# Patient Record
Sex: Female | Born: 1970 | Race: White | Hispanic: No | Marital: Single | Smoking: Current every day smoker
Health system: Southern US, Community
[De-identification: ages and names within clinical notes are randomized; demographics above are authoritative.]

## PROBLEM LIST (undated history)

## (undated) DIAGNOSIS — D649 Anemia, unspecified: Secondary | ICD-10-CM

## (undated) DIAGNOSIS — F329 Major depressive disorder, single episode, unspecified: Secondary | ICD-10-CM

## (undated) DIAGNOSIS — F1910 Other psychoactive substance abuse, uncomplicated: Secondary | ICD-10-CM

## (undated) DIAGNOSIS — I219 Acute myocardial infarction, unspecified: Secondary | ICD-10-CM

## (undated) DIAGNOSIS — J449 Chronic obstructive pulmonary disease, unspecified: Secondary | ICD-10-CM

## (undated) DIAGNOSIS — F32A Depression, unspecified: Secondary | ICD-10-CM

## (undated) HISTORY — DX: Anemia, unspecified: D64.9

## (undated) HISTORY — PX: TUBAL LIGATION: SHX77

---

## 2002-04-16 ENCOUNTER — Emergency Department (HOSPITAL_COMMUNITY): Admission: EM | Admit: 2002-04-16 | Discharge: 2002-04-17 | Payer: Self-pay | Admitting: Emergency Medicine

## 2003-09-11 ENCOUNTER — Encounter: Payer: Self-pay | Admitting: Emergency Medicine

## 2003-09-11 ENCOUNTER — Emergency Department (HOSPITAL_COMMUNITY): Admission: EM | Admit: 2003-09-11 | Discharge: 2003-09-11 | Payer: Self-pay | Admitting: Emergency Medicine

## 2003-09-18 ENCOUNTER — Encounter: Admission: RE | Admit: 2003-09-18 | Discharge: 2003-09-18 | Payer: Self-pay | Admitting: Internal Medicine

## 2003-09-25 ENCOUNTER — Inpatient Hospital Stay (HOSPITAL_COMMUNITY): Admission: AD | Admit: 2003-09-25 | Discharge: 2003-09-30 | Payer: Self-pay | Admitting: Infectious Diseases

## 2003-09-25 ENCOUNTER — Encounter: Admission: RE | Admit: 2003-09-25 | Discharge: 2003-09-25 | Payer: Self-pay | Admitting: Internal Medicine

## 2003-09-27 ENCOUNTER — Encounter (INDEPENDENT_AMBULATORY_CARE_PROVIDER_SITE_OTHER): Payer: Self-pay | Admitting: Specialist

## 2003-09-28 ENCOUNTER — Encounter: Payer: Self-pay | Admitting: Infectious Diseases

## 2005-02-23 ENCOUNTER — Emergency Department (HOSPITAL_COMMUNITY): Admission: EM | Admit: 2005-02-23 | Discharge: 2005-02-23 | Payer: Self-pay | Admitting: Emergency Medicine

## 2005-05-16 ENCOUNTER — Emergency Department: Payer: Self-pay | Admitting: Internal Medicine

## 2005-05-16 ENCOUNTER — Other Ambulatory Visit: Payer: Self-pay

## 2006-04-28 ENCOUNTER — Emergency Department (HOSPITAL_COMMUNITY): Admission: EM | Admit: 2006-04-28 | Discharge: 2006-04-28 | Payer: Self-pay | Admitting: *Deleted

## 2006-04-29 ENCOUNTER — Ambulatory Visit: Payer: Self-pay | Admitting: Family Medicine

## 2006-06-18 ENCOUNTER — Emergency Department: Payer: Self-pay | Admitting: General Practice

## 2006-06-24 ENCOUNTER — Ambulatory Visit: Payer: Self-pay | Admitting: Gastroenterology

## 2006-07-26 ENCOUNTER — Ambulatory Visit: Payer: Self-pay | Admitting: Gastroenterology

## 2006-07-29 ENCOUNTER — Ambulatory Visit: Payer: Self-pay | Admitting: Gastroenterology

## 2007-11-28 ENCOUNTER — Emergency Department: Payer: Self-pay | Admitting: Emergency Medicine

## 2008-12-17 ENCOUNTER — Ambulatory Visit: Payer: Self-pay | Admitting: Family Medicine

## 2008-12-17 DIAGNOSIS — K219 Gastro-esophageal reflux disease without esophagitis: Secondary | ICD-10-CM

## 2008-12-17 DIAGNOSIS — R109 Unspecified abdominal pain: Secondary | ICD-10-CM | POA: Insufficient documentation

## 2008-12-17 DIAGNOSIS — F341 Dysthymic disorder: Secondary | ICD-10-CM

## 2008-12-17 DIAGNOSIS — F17200 Nicotine dependence, unspecified, uncomplicated: Secondary | ICD-10-CM

## 2008-12-31 ENCOUNTER — Ambulatory Visit: Payer: Self-pay | Admitting: Family Medicine

## 2009-01-07 ENCOUNTER — Encounter: Payer: Self-pay | Admitting: *Deleted

## 2009-01-16 ENCOUNTER — Encounter: Payer: Self-pay | Admitting: Family Medicine

## 2009-01-16 ENCOUNTER — Ambulatory Visit: Payer: Self-pay | Admitting: Family Medicine

## 2009-01-16 DIAGNOSIS — F319 Bipolar disorder, unspecified: Secondary | ICD-10-CM | POA: Insufficient documentation

## 2009-01-16 LAB — CONVERTED CEMR LAB
BUN: 8 mg/dL (ref 6–23)
Calcium: 8.5 mg/dL (ref 8.4–10.5)
Chloride: 107 meq/L (ref 96–112)
Glucose, Bld: 85 mg/dL (ref 70–99)
Potassium: 4.5 meq/L (ref 3.5–5.3)

## 2009-02-12 ENCOUNTER — Telehealth: Payer: Self-pay | Admitting: Family Medicine

## 2009-02-18 ENCOUNTER — Telehealth: Payer: Self-pay | Admitting: Family Medicine

## 2009-02-25 ENCOUNTER — Telehealth: Payer: Self-pay | Admitting: *Deleted

## 2009-03-07 ENCOUNTER — Ambulatory Visit: Payer: Self-pay | Admitting: Family Medicine

## 2009-05-06 ENCOUNTER — Encounter: Payer: Self-pay | Admitting: Family Medicine

## 2009-05-06 ENCOUNTER — Ambulatory Visit: Payer: Self-pay | Admitting: Family Medicine

## 2009-06-07 ENCOUNTER — Ambulatory Visit: Payer: Self-pay | Admitting: Family Medicine

## 2009-07-10 ENCOUNTER — Ambulatory Visit: Payer: Self-pay | Admitting: Family Medicine

## 2009-07-10 ENCOUNTER — Encounter: Payer: Self-pay | Admitting: Family Medicine

## 2009-07-16 LAB — CONVERTED CEMR LAB
Cocaine Metabolites: NEGATIVE
Creatinine,U: 68.1 mg/dL
Opiate Screen, Urine: NEGATIVE

## 2009-08-09 ENCOUNTER — Ambulatory Visit: Payer: Self-pay | Admitting: Family Medicine

## 2009-09-12 ENCOUNTER — Ambulatory Visit: Payer: Self-pay | Admitting: Family Medicine

## 2009-10-11 ENCOUNTER — Ambulatory Visit: Payer: Self-pay | Admitting: Family Medicine

## 2009-12-02 ENCOUNTER — Ambulatory Visit: Payer: Self-pay | Admitting: Family Medicine

## 2009-12-30 ENCOUNTER — Telehealth: Payer: Self-pay | Admitting: *Deleted

## 2010-01-30 ENCOUNTER — Ambulatory Visit: Payer: Self-pay | Admitting: Family Medicine

## 2010-03-05 ENCOUNTER — Ambulatory Visit: Payer: Self-pay | Admitting: Family Medicine

## 2010-03-05 ENCOUNTER — Encounter: Payer: Self-pay | Admitting: Family Medicine

## 2010-03-05 DIAGNOSIS — D649 Anemia, unspecified: Secondary | ICD-10-CM | POA: Insufficient documentation

## 2010-03-05 LAB — CONVERTED CEMR LAB
ALT: 35 units/L (ref 0–35)
AST: 44 units/L — ABNORMAL HIGH (ref 0–37)
Albumin: 3.8 g/dL (ref 3.5–5.2)
Alkaline Phosphatase: 83 units/L (ref 39–117)
BUN: 5 mg/dL — ABNORMAL LOW (ref 6–23)
Calcium: 8.5 mg/dL (ref 8.4–10.5)
Chloride: 104 meq/L (ref 96–112)
HCT: 30 % — ABNORMAL LOW (ref 36.0–46.0)
Hemoglobin: 8.1 g/dL — ABNORMAL LOW (ref 12.0–15.0)
Lipase: 17 units/L (ref 0–75)
MCHC: 27 g/dL — ABNORMAL LOW (ref 30.0–36.0)
Potassium: 4.9 meq/L (ref 3.5–5.3)
Sodium: 135 meq/L (ref 135–145)
Total Bilirubin: 0.2 mg/dL — ABNORMAL LOW (ref 0.3–1.2)
WBC: 4.8 10*3/uL (ref 4.0–10.5)

## 2010-03-06 ENCOUNTER — Encounter: Payer: Self-pay | Admitting: Family Medicine

## 2010-03-07 LAB — CONVERTED CEMR LAB
Ferritin: 3 ng/mL — ABNORMAL LOW (ref 10–291)
Vitamin B-12: 233 pg/mL (ref 211–911)

## 2010-03-26 ENCOUNTER — Emergency Department (HOSPITAL_COMMUNITY): Admission: EM | Admit: 2010-03-26 | Discharge: 2010-03-27 | Payer: Self-pay | Admitting: Emergency Medicine

## 2010-06-03 ENCOUNTER — Encounter: Payer: Self-pay | Admitting: Family Medicine

## 2010-06-12 ENCOUNTER — Ambulatory Visit: Payer: Self-pay | Admitting: Family Medicine

## 2010-07-24 ENCOUNTER — Ambulatory Visit: Payer: Self-pay | Admitting: Family Medicine

## 2010-07-24 ENCOUNTER — Encounter: Payer: Self-pay | Admitting: Family Medicine

## 2010-07-24 DIAGNOSIS — D509 Iron deficiency anemia, unspecified: Secondary | ICD-10-CM

## 2010-07-25 LAB — CONVERTED CEMR LAB
AST: 32 units/L (ref 0–37)
Alkaline Phosphatase: 55 units/L (ref 39–117)
BUN: 5 mg/dL — ABNORMAL LOW (ref 6–23)
CO2: 26 meq/L (ref 19–32)
Chloride: 105 meq/L (ref 96–112)
Ferritin: 5 ng/mL — ABNORMAL LOW (ref 10–291)
Glucose, Bld: 89 mg/dL (ref 70–99)
Hemoglobin: 9 g/dL — ABNORMAL LOW (ref 12.0–15.0)
Iron: 14 ug/dL — ABNORMAL LOW (ref 42–145)
Platelets: 311 10*3/uL (ref 150–400)
Potassium: 4 meq/L (ref 3.5–5.3)
Saturation Ratios: 3 % — ABNORMAL LOW (ref 20–55)
Sodium: 137 meq/L (ref 135–145)
TIBC: 412 ug/dL (ref 250–470)
Total Protein: 7 g/dL (ref 6.0–8.3)
Valproic Acid Lvl: 26 ug/mL — ABNORMAL LOW (ref 50.0–100.0)

## 2010-08-11 ENCOUNTER — Telehealth: Payer: Self-pay | Admitting: Family Medicine

## 2010-08-12 ENCOUNTER — Telehealth: Payer: Self-pay | Admitting: Family Medicine
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
## 2011-01-20 NOTE — Miscellaneous (Signed)
Summary: patient summary  Clinical Lists Changes  Problems: Assessed ABDOMINAL PAIN as comment only - unclear etiology have tried multiple times to get records from [facility] where patient reports she's had a workup.  patient reports she thinks she was told she had celiac sprue but patient doesn't follow gluten free diet.  thought this could be related to menses - tried on depoprovera despite having BTL to try to stop menses which were contributory. has been managed by prior pcp from outside clinic with chronic narcotics.  we have been trying to slowly taper these down. she recently went to ED to try to stop narcotics - would pursue this.  she is under controlled substances agreement at [facility]. we have started workup at [facility] because we couldn't get outside records.  discovered anemia (fairly profound) but we have been unable to get in touch with patient to get her in for further workup of anemia.  Assessed BIPOLAR DISORDER UNSPECIFIED as comment only - I suspect this is at least part of the reason for her abdominal pain.  she is likely bipolar 2 with only hypomanic symptoms - predominately depressive symptoms. she is being treated with citalopram but hasn't followed up as instructed to see how it is doing for her.    Assessed ANEMIA as comment only - see abdominal pain assessment Observations: Added new observation of PAST MED HX: stomach problems/chronic abdominal pain - GERD [id number] - both vaginal deliveries depression -? bipolar, anxiety ANEMIA (ICD-285.9)  TOBACCO ABUSE (ICD-305.1)    (06/03/2010 9:21)      Past History:  Past Medical History: stomach problems/chronic abdominal pain - GERD [id number] - both vaginal deliveries depression -? bipolar, anxiety ANEMIA (ICD-285.9)  TOBACCO ABUSE (ICD-305.1)    Impression & Recommendations:  Problem # 1:  ABDOMINAL PAIN (ICD-789.00) unclear etiology have tried multiple times to get records from [facility] where patient reports she's  had a workup.  patient reports she thinks she was told she had celiac sprue but patient doesn't follow gluten free diet.  thought this could be related to menses - tried on depoprovera despite having BTL to try to stop menses which were contributory. has been managed by prior pcp from outside clinic with chronic narcotics.  we have been trying to slowly taper these down. she recently went to ED to try to stop narcotics - would pursue this.  she is under controlled substances agreement at [facility]. we have started workup at [facility] because we couldn't get outside records.  discovered anemia (fairly profound) but we have been unable to get in touch with patient to get her in for further workup of anemia.  Problem # 2:  BIPOLAR DISORDER UNSPECIFIED (ICD-296.80) I suspect this is at least part of the reason for her abdominal pain.  she is likely bipolar 2 with only hypomanic symptoms - predominately depressive symptoms. she is being treated with citalopram but hasn't followed up as instructed to see how it is doing for her.  Problem # 3:  ANEMIA (ICD-285.9) see abdominal pain assessment  Complete Medication List: 1)  Omeprazole 40 Mg Cpdr (Omeprazole) .Marland Kitchen.. 1 by mouth two times a day for reflux/stomach pain 2)  Colace 100 Mg Caps (Docusate sodium) .Marland Kitchen.. 1 by mouth qam 3)  Citalopram Hydrobromide 40 Mg Tabs (Citalopram hydrobromide) .... Taper up as directed to 1.5 tabs daily 4)  Oxycodone-acetaminophen 5-325 Mg Tabs (Oxycodone-acetaminophen) .Marland Kitchen.. 1 two times a day as needed severe pain. 5)  Oxycodone Hcl 5 Mg Tabs (Oxycodone hcl) .Marland KitchenMarland KitchenMarland Kitchen 1  by mouth two times a day as needed severe pain. 6)  Miralax Powd (Polyethylene glycol 3350) .Marland Kitchen.. 1 dose daily for constipation
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
## 2011-01-20 NOTE — Progress Notes (Signed)
Summary: RX Req  Phone Note Refill Request Call back at Home Phone [phone] Message from:  Patient  Refills Requested: Medication #1:  BACLOFEN 20 MG TABS 1/2 tab by mouth three times a day [facility]  Initial call taken by: Raymond Gurney,  August 12, 2010 11:07 AM    Prescriptions: OMEPRAZOLE 40 MG CPDR (OMEPRAZOLE) 1 by mouth two times a day for reflux/stomach pain  #60 x 1   Entered and Authorized by:   Mariana Arn  MD   Signed by:   Mariana Arn  MD on 08/13/2010   Method used:   Electronically to        [facility] (retail)       [address]       [place], [place]  [address]       Ph: [phone]       Fax: [phone]   RxID:   [id number] BACLOFEN 20 MG TABS (BACLOFEN) 1/2 tab by mouth three times a day  #41 x 0   Entered and Authorized by:   Mariana Arn  MD   Signed by:   Mariana Arn  MD on 08/13/2010   Method used:   [facility]ically to        [facility] [address] [phone][facility] (retail)       [address]       [place], [place]  [address]       Ph: [phone] or [phone]       Fax: [phone]   RxID:   [id number] BACLOFEN 20 MG TABS (BACLOFEN) 1/2 tab by mouth three times a day  #41 x 0   Entered and Authorized by:   Mariana Arn  MD   Signed by:   Mariana Arn  MD on 08/13/2010   Method used:   Electronically to        [facility] (retail)       [address]       [place], [place]  [address]       Ph: [phone]       Fax: [phone]   [place]:   [phone]

## 2011-01-20 NOTE — Progress Notes (Signed)
Summary: refill  Phone Note Refill Request Call back at [phone] Message from:  Patient  Refills Requested: Medication #1:  CITALOPRAM HYDROBROMIDE 40 MG TABS taper up as directed to 1 tab daily. Initial call taken by: Audie Clear,  December 30, 2009 8:35 AM  Follow-up for Phone Call       Follow-up by: Elige Radon RN,  December 30, 2009 8:44 AM    Prescriptions: CITALOPRAM HYDROBROMIDE 40 MG TABS (CITALOPRAM HYDROBROMIDE) taper up as directed to 1 tab daily  #30 x 3   Entered by:   Elige Radon RN   Authorized by:   Patria Mane  MD   Signed by:   Elige Radon RN on 12/30/2009   Method used:   Electronically to        [facility] (retail)       [address]       [place], [place]  [address]       Ph: [phone]       Fax: [phone]   RxID:   [id number]
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
## 2011-01-20 NOTE — Progress Notes (Signed)
Summary: refill  Phone Note Refill Request Call back at Home Phone [phone] Message from:  Patient  Refills Requested: Medication #1:  OMEPRAZOLE 40 MG CPDR 1 by mouth two times a day for reflux/stomach pain [facility]  Initial call taken by: Audie Clear,  August 11, 2010 10:25 AM    Prescriptions: OMEPRAZOLE 40 MG CPDR (OMEPRAZOLE) 1 by mouth two times a day for reflux/stomach pain  #60 x 3   Entered and Authorized by:   Mariana Arn  MD   Signed by:   Mariana Arn  MD on 08/11/2010   Method used:   [facility]ronically to        [facility] [address] [phone]* (retail)       [address]       [place], [place]  [address]       Ph: [phone] or [phone]       Fax: [phone]   RxID:   [id number]

## 2011-03-11 LAB — RAPID URINE DRUG SCREEN, HOSP PERFORMED
Amphetamines: NOT DETECTED
Barbiturates: NOT DETECTED
Tetrahydrocannabinol: POSITIVE — AB

## 2011-03-11 LAB — POCT I-STAT, CHEM 8
Calcium, Ion: 1.1 mmol/L — ABNORMAL LOW (ref 1.12–1.32)
Chloride: 102 mEq/L (ref 96–112)
Hemoglobin: 9.2 g/dL — ABNORMAL LOW (ref 12.0–15.0)
Potassium: 3.1 mEq/L — ABNORMAL LOW (ref 3.5–5.1)

## 2011-03-11 LAB — ACETAMINOPHEN LEVEL: Acetaminophen (Tylenol), Serum: <10 ug/mL — ABNORMAL LOW (ref 10–30)

## 2011-04-13 ENCOUNTER — Emergency Department (HOSPITAL_COMMUNITY)
Admission: EM | Admit: 2011-04-13 | Discharge: 2011-04-13 | Disposition: A | Discharge status: Home / Self Care | Payer: Self-pay | Attending: Emergency Medicine | Admitting: Emergency Medicine

## 2011-04-13 DIAGNOSIS — R112 Nausea with vomiting, unspecified: Secondary | ICD-10-CM | POA: Insufficient documentation

## 2011-04-13 DIAGNOSIS — R109 Unspecified abdominal pain: Secondary | ICD-10-CM | POA: Insufficient documentation

## 2011-04-13 DIAGNOSIS — G8929 Other chronic pain: Secondary | ICD-10-CM | POA: Insufficient documentation

## 2011-04-13 LAB — CBC
MCHC: 31.5 g/dL (ref 30.0–36.0)
Platelets: 382 10*3/uL (ref 150–400)
WBC: 6.6 10*3/uL (ref 4.0–10.5)

## 2011-04-13 LAB — LIPASE, BLOOD: Lipase: 25 U/L (ref 11–59)

## 2011-04-13 LAB — COMPREHENSIVE METABOLIC PANEL
ALT: 62 U/L — ABNORMAL HIGH (ref 0–35)
AST: 63 U/L — ABNORMAL HIGH (ref 0–37)
Albumin: 2.4 g/dL — ABNORMAL LOW (ref 3.5–5.2)
Alkaline Phosphatase: 103 U/L (ref 39–117)
BUN: 10 mg/dL (ref 6–23)
Calcium: 8.2 mg/dL — ABNORMAL LOW (ref 8.4–10.5)
GFR calc Af Amer: >60 mL/min (ref >60)
Glucose, Bld: 86 mg/dL (ref 70–99)
Total Protein: 5.5 g/dL — ABNORMAL LOW (ref 6.0–8.3)

## 2011-04-13 LAB — URINALYSIS, ROUTINE W REFLEX MICROSCOPIC
Ketones, ur: NEGATIVE mg/dL
Nitrite: NEGATIVE
Protein, ur: NEGATIVE mg/dL
Specific Gravity, Urine: 1.017 (ref 1.005–1.030)
Urobilinogen, UA: 1 mg/dL (ref 0.0–1.0)

## 2011-04-13 LAB — DIFFERENTIAL
Basophils Absolute: 0 10*3/uL (ref 0.0–0.1)
Basophils Relative: 0 % (ref 0–1)
Lymphocytes Relative: 29 % (ref 12–46)
Monocytes Absolute: 0.6 10*3/uL (ref 0.1–1.0)
Monocytes Relative: 9 % (ref 3–12)
Neutro Abs: 3.8 10*3/uL (ref 1.7–7.7)

## 2011-05-08 NOTE — Op Note (Signed)
   NAME:  Denise Alexander, STANISH                           ACCOUNT NO.:  [id number]   MEDICAL RECORD NO.:  [id number]                   PATIENT TYPE:  INP   LOCATION:  4704                                 FACILITY:  [facility]   PHYSICIAN:  Ronald Lobo, M.D.                DATE OF BIRTH:  05/05/1971   DATE OF PROCEDURE:  09/27/2003  DATE OF DISCHARGE:                                 OPERATIVE REPORT   PROCEDURE:  Upper endoscopy with biopsies.   INDICATIONS FOR PROCEDURE:  A 40 year old female with heme positive stool,  anemia and history of recent dyspeptic symptoms in the setting of  significant aspirin exposure.   FINDINGS:  Normal exam.   DESCRIPTION OF PROCEDURE:  The nature, purpose and risk of the procedure had  been discussed with the patient who provided written consent. Sedation was  fentanyl 100 mcg and Versed 10 mg IV without arrhythmias or desaturation.  The patient also received Phenergan 25 mg IV as an adjunct to her sedation.   The Olympus small caliber adult video endoscope is passed under direct  vision. The esophagus was readily entered and was normal in its entirety,  without evidence of reflux esophagitis, Barrett's esophagus, varices,  infection, neoplasia or any ring, stricture, or hiatal hernia.   The stomach contained a small bilious residual which was suctioned up. No  gastritis, erosions, ulcers, polyps or masses were observed. The pylorus  duodenal bulb and duodenal mucosa looked normal. Random biopsies were  obtained from the duodenum to help rule out celiac disease. The scope was  then removed from the patient. Retroflexed viewing was unremarkable. She  tolerated the procedure well and there were no apparent complications.   IMPRESSION:  Essentially normal endoscopy. No source of heme positive stool,  anemia, dyspeptic symptoms or evidence of aspirin, gastropathy identified.   PLAN:  Await pathology results.     Ronald Lobo, M.D.    RB/MEDQ  D:  09/27/2003  T:  09/27/2003  Job:  [id number]   cc:   [facility]

## 2011-05-08 NOTE — Op Note (Signed)
   NAME:  Denise Alexander, Denise Alexander                           ACCOUNT NO.:  [id number]   MEDICAL RECORD NO.:  [id number]                   PATIENT TYPE:  INP   LOCATION:  4704                                 FACILITY:  [facility]   PHYSICIAN:  Ronald Lobo, M.D.                DATE OF BIRTH:  1971/02/27   DATE OF PROCEDURE:  09/27/2003  DATE OF DISCHARGE:                                 OPERATIVE REPORT   PROCEDURE:  Colonoscopy with biopsy.   INDICATIONS FOR PROCEDURE:  A 40 year old female with distant family history  of colon cancer, heme positive stool, anemia, and unexplained lower  abdominal pain.   FINDINGS:  Essentially normal exam. Tiny rectal polyp removed.   DESCRIPTION OF PROCEDURE:  The nature, purpose and risk of the procedure had  been discussed with the patient who provided written consent and was brought  from her hospital room to the endoscopy unit. The prep was Fleets Phospho-  Soda and the quality of the prep was excellent so it is felt that all areas  were well seen. Sedation for this procedure and the upper endoscopy which  preceded it totaled fentanyl 125 mcg, Versed 12.5 mg and Phenergan 25 mg IV  without arrhythmias or desaturation.   The Olympus adjustable tension pediatric video colonoscope was advanced to  the terminal ileum which was entered for a mild to moderate distance and  pullback was then performed. We turned the patient into the supine position  to facilitate advancement during the exam and there was a little bit of  external abdominal compression applied as well to help facilitate  advancement.   Pullback was then performed. There was a 2-3 mm sessile hyperplastic  appearing rectal polyp at about 15 cm removed by a single cold biopsy. No  large polyps, cancer, colitis, vascular malformations or diverticulosis were  noted. Retroflexion was not performed in the rectum due to a small rectal  ampulla but antegrade viewing disclosed no distal rectal lesions.   The patient tolerated the procedure well and there were no apparent  complications.   IMPRESSION:  Tiny rectal polyp, probably not clinically significant. No  source of heme positive stool, anemia or low abdominal pain identified.   PLAN:  Await pathology results. The patient should probably have continued  periodic screening colonoscopy every 5-10 years or so or undergo some other  form of colon cancer screening at the discretion of her primary physician.                                               Ronald Lobo, M.D.    RB/MEDQ  D:  09/27/2003  T:  09/27/2003  Job:  [id number]   cc:   [facility]
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
## 2011-10-29 ENCOUNTER — Observation Stay (HOSPITAL_COMMUNITY)
Admission: EM | Admit: 2011-10-29 | Discharge: 2011-10-30 | Disposition: A | Discharge status: Home / Self Care | Payer: Self-pay | Attending: Emergency Medicine | Admitting: Emergency Medicine

## 2011-10-29 ENCOUNTER — Encounter: Payer: Self-pay | Admitting: Emergency Medicine

## 2011-10-29 DIAGNOSIS — M25579 Pain in unspecified ankle and joints of unspecified foot: Secondary | ICD-10-CM | POA: Insufficient documentation

## 2011-10-29 DIAGNOSIS — M79609 Pain in unspecified limb: Secondary | ICD-10-CM | POA: Insufficient documentation

## 2011-10-29 DIAGNOSIS — Z7982 Long term (current) use of aspirin: Secondary | ICD-10-CM | POA: Insufficient documentation

## 2011-10-29 DIAGNOSIS — M7989 Other specified soft tissue disorders: Secondary | ICD-10-CM | POA: Insufficient documentation

## 2011-10-29 DIAGNOSIS — R609 Edema, unspecified: Secondary | ICD-10-CM | POA: Insufficient documentation

## 2011-10-29 DIAGNOSIS — Z79899 Other long term (current) drug therapy: Secondary | ICD-10-CM | POA: Insufficient documentation

## 2011-10-29 DIAGNOSIS — M129 Arthropathy, unspecified: Principal | ICD-10-CM | POA: Insufficient documentation

## 2011-10-29 DIAGNOSIS — M1990 Unspecified osteoarthritis, unspecified site: Secondary | ICD-10-CM

## 2011-10-29 MED ORDER — ONDANSETRON 4 MG PO TBDP
4.0000 mg | ORAL_TABLET | Freq: Once | ORAL | Status: AC
  Administered 2011-10-29: 4 mg via ORAL
  Filled 2011-10-29: qty 1

## 2011-10-29 NOTE — ED Notes (Signed)
The sister with the pt has been out to the desk complaining that she has not been seen yet. She appears to be getting more and more agitated.

## 2011-10-29 NOTE — ED Notes (Signed)
Th ept has had both feet and leg swelling for  One week.  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  She has pain in that area

## 2011-10-29 NOTE — ED Notes (Signed)
PT. REPORTS BILATERAL LOWER LEG EDEMA / SWELLING X 1 WEEK , DENIES INJURY OR FALL . ANKLES PAINFULL TO TOUCH .  SLIGHT CHILLS . NO FEVER . DENIES SOB.

## 2011-10-30 ENCOUNTER — Other Ambulatory Visit: Payer: Self-pay

## 2011-10-30 ENCOUNTER — Emergency Department (HOSPITAL_COMMUNITY): Payer: Self-pay

## 2011-10-30 ENCOUNTER — Encounter (HOSPITAL_COMMUNITY): Payer: Self-pay | Admitting: Radiology

## 2011-10-30 DIAGNOSIS — M7989 Other specified soft tissue disorders: Secondary | ICD-10-CM

## 2011-10-30 DIAGNOSIS — M79609 Pain in unspecified limb: Secondary | ICD-10-CM

## 2011-10-30 LAB — URINALYSIS, ROUTINE W REFLEX MICROSCOPIC
Hgb urine dipstick: NEGATIVE
Protein, ur: NEGATIVE mg/dL
Specific Gravity, Urine: 1.022 (ref 1.005–1.030)
pH: 6 (ref 5.0–8.0)

## 2011-10-30 LAB — CBC
HCT: 29.2 % — ABNORMAL LOW (ref 36.0–46.0)
Platelets: 297 10*3/uL (ref 150–400)
WBC: 3.4 10*3/uL — ABNORMAL LOW (ref 4.0–10.5)

## 2011-10-30 LAB — POCT I-STAT TROPONIN I: Troponin i, poc: 0 ng/mL (ref 0.00–0.08)

## 2011-10-30 LAB — POCT I-STAT, CHEM 8
Creatinine, Ser: 0.6 mg/dL (ref 0.50–1.10)
HCT: 33 % — ABNORMAL LOW (ref 36.0–46.0)
Hemoglobin: 11.2 g/dL — ABNORMAL LOW (ref 12.0–15.0)
Sodium: 140 mEq/L (ref 135–145)
TCO2: 24 mmol/L (ref 0–100)

## 2011-10-30 LAB — URINE MICROSCOPIC-ADD ON

## 2011-10-30 LAB — PROTIME-INR: Prothrombin Time: 15.8 seconds — ABNORMAL HIGH (ref 11.6–15.2)

## 2011-10-30 MED ORDER — POTASSIUM CHLORIDE CRYS ER 20 MEQ PO TBCR
40.0000 meq | EXTENDED_RELEASE_TABLET | Freq: Once | ORAL | Status: AC
  Administered 2011-10-30: 40 meq via ORAL
  Filled 2011-10-30: qty 2

## 2011-10-30 MED ORDER — IOHEXOL 350 MG/ML SOLN
100.0000 mL | Freq: Once | INTRAVENOUS | Status: AC | PRN
  Administered 2011-10-30: 100 mL via INTRAVENOUS

## 2011-10-30 MED ORDER — KETOROLAC TROMETHAMINE 30 MG/ML IJ SOLN
30.0000 mg | Freq: Once | INTRAMUSCULAR | Status: AC
  Administered 2011-10-30: 30 mg via INTRAVENOUS

## 2011-10-30 MED ORDER — IBUPROFEN 800 MG PO TABS: 800.0000 mg | ORAL_TABLET | Freq: Three times a day (TID) | ORAL | Status: AC

## 2011-10-30 MED ORDER — KETOROLAC TROMETHAMINE 30 MG/ML IJ SOLN
INTRAMUSCULAR | Status: AC
  Administered 2011-10-30: 30 mg via INTRAVENOUS
  Filled 2011-10-30: qty 1

## 2011-10-30 MED ORDER — ENOXAPARIN SODIUM 60 MG/0.6ML ~~LOC~~ SOLN
50.0000 mg | SUBCUTANEOUS | Status: AC
  Administered 2011-10-30: 50 mg via SUBCUTANEOUS
  Filled 2011-10-30: qty 0.6

## 2011-10-30 NOTE — ED Notes (Signed)
Patient to CT.

## 2011-10-30 NOTE — ED Notes (Signed)
Meal given to patient 

## 2011-10-30 NOTE — ED Provider Notes (Signed)
Pt in CDU on DVT protocol.  She continues to have pain bilateral ankles but otherwise feeling well.  On re-examination, afebrile, VSS, mild edema lateral ankles w/out overlying erythema and tenderness to light palpation bilateral medial/lateral malleolus.  Pain w/ passive ROM ankle.  No edema/tenderness lower legs or feet.  2+ DP pulses and sensation intact.  Dopplers pending.  Toradol ordered for pain.  8:09 AM   Pt reports some relief of pain w/ toradol.  Dopplers neg for DVT.  Uric acid ordered and w/in nml range.  All results discussed w/ pt and I explained that life threatening causes of sx were ruled out w/ exam and [place].  Recommended f/u with her PCP because she may need inflammatory markers checked.  Also recommended RICE and that she use a walker until the pain improves.  Pt suddenly became very angry because she wasted an entire night for nothing.  I again explained that we had ruled out serious causes of ankle pain/edema and have given her recommendations for further management.  She yelled that she had already tried everything I recommended.  Charge nurse tried to calm pt but she stormed out.    [place], [place] 10/30/11 1831

## 2011-10-30 NOTE — Progress Notes (Signed)
ANTICOAGULATION CONSULT NOTE -  Follow Up Consult  Pharmacy Consult for lovenox  Indication: DVT  No Known Allergies  Patient Measurements:   Adjusted Body Weight: 46kg  Vital Signs: Temp: 98.2 F (36.8 C) (11/09 0316) Temp src: Oral (11/09 0316) BP: 112/74 mmHg (11/09 0316) Pulse Rate: 60  (11/09 0316)  Labs:  Basename 10/30/11 0300 10/30/11 0046 10/30/11 0018  HGB -- 11.2* 9.2*  HCT -- 33.0* 29.2*  PLT -- -- 297  APTT 32 -- --  LABPROT 15.8* -- --  INR 1.23 -- --  HEPARINUNFRC -- -- --  CREATININE -- 0.60 --  CKTOTAL -- -- --  CKMB -- -- --  TROPONINI -- -- --   The CrCl is unknown because both a height and weight (above a minimum accepted value) are required for this calculation.   Medications:   (Not in a hospital admission)  Assessment:  Lovenox for r/o dvt. Pt holding in cdu for observation  Goal of Therapy:  lovenox 49m/kg     Plan:  Lovenox 50 mg x1 dose to send to cdu  ECurlene Dolphin11/08/2011,4:09 AM

## 2011-10-30 NOTE — ED Notes (Signed)
Patient awake and spouse at bedside . Pt awaiting lower extremity doppler studies.  Pt placed in gown to be prepared for study.  She is aware a breakfast tray has been ordered. Coffee to her as requested.  Pt states most of her pain is in her ankles and feet. States is worse when she stands. 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  Her left foot appears more swollen that right. Bilateral pedal pulses are strong. No redness noted. Pt is agreeable to plan of care

## 2011-10-30 NOTE — ED Provider Notes (Addendum)
History     CSN: [id number] Arrival date & time: 10/29/2011  9:00 PM   First MD Initiated Contact with Patient 10/29/11 2311      Chief Complaint  Patient presents with  . Leg Swelling    (Consider location/radiation/quality/duration/timing/severity/associated sxs/prior treatment) Patient is a 40 y.o. female presenting with leg pain.  Leg Pain  The incident occurred more than 1 week ago. The incident occurred at work. There was no injury mechanism. The pain location is generalized. The quality of the pain is described as throbbing. The pain is at a severity of 8/10. The pain is severe. The pain has been constant since onset. Pertinent negatives include no numbness, no inability to bear weight, no loss of motion, no muscle weakness, no loss of sensation and no tingling. She reports no foreign bodies present. The symptoms are aggravated by nothing. She has tried nothing for the symptoms. The treatment provided no relief.  B symmetrical peripheral edema.  Improves only slightly with elevation.  No long car trips or plane trips no OCP.  No CP, SOB, DOE. No n/v/d.   History reviewed. No pertinent past medical history.  History reviewed. No pertinent past surgical history.  No family history on file.  History  Substance Use Topics  . Smoking status: Current Everyday Smoker  . Smokeless tobacco: Not on file  . Alcohol Use: No    OB History    Grav Para Term Preterm Abortions TAB SAB Ect Mult Living                  Review of Systems  Constitutional: Negative for fever, chills and activity change.  HENT: Negative for facial swelling.   Eyes: Negative for discharge.  Respiratory: Negative for chest tightness and shortness of breath.   Cardiovascular: Negative for chest pain.  Gastrointestinal: Negative for abdominal distention.  Genitourinary: Negative for difficulty urinating.  Musculoskeletal: Positive for arthralgias. Negative for gait problem.  Neurological: Negative for  tingling, weakness and numbness.  Hematological: Negative.   Psychiatric/Behavioral: Negative.     Allergies  Review of patient's allergies indicates no known allergies.  Home Medications   Current Outpatient Rx  Name Route Sig Dispense Refill  . ASPIRIN-SALICYLAMIDE-CAFFEINE 937-902-40.9 MG PO PACK Oral Take 1 packet by mouth every 4 (four) hours as needed.      Marland Kitchen CITALOPRAM HYDROBROMIDE 40 MG PO TABS  Taper up as directed to 1.5 tablets daily.     Marland Kitchen FERROUS SULFATE 325 (65 FE) MG PO TABS Oral Take 325 mg by mouth daily with breakfast. For anemia.     . WOMENS MULTIVITAMIN PLUS PO Oral Take by mouth daily.      Marland Kitchen OMEPRAZOLE 40 MG PO CPDR Oral Take 40 mg by mouth daily. For reflux/stomach pain.     . TRAZODONE HCL 100 MG PO TABS Oral Take 100 mg by mouth at bedtime. As needed.     Marland Kitchen BACLOFEN 20 MG PO TABS  20 mg. 1/2 tablet by mouth three times a day.     Marland Kitchen DIVALPROEX SODIUM 250 MG PO TBEC  Take 2 tablets by mouth at bedtime for mood.    Marland Kitchen PREGABALIN 50 MG PO CAPS Oral Take 50 mg by mouth 3 (three) times daily.        BP 108/75  Pulse 75  Temp(Src) 97.9 F (36.6 C) (Oral)  Resp 17  SpO2 100%  Physical Exam  Constitutional: She is oriented to person, place, and time. She appears well-developed and  well-nourished. No distress.  HENT:  Head: Normocephalic and atraumatic.  Eyes: EOM are normal. Pupils are equal, round, and reactive to light. Right eye exhibits no discharge. Left eye exhibits no discharge.  Neck: Normal range of motion. Neck supple.  Cardiovascular: Normal rate and regular rhythm.  Exam reveals no friction rub.   Pulmonary/Chest: Effort normal and breath sounds normal.  Abdominal: Soft. Bowel sounds are normal. She exhibits no distension. There is no tenderness.  Musculoskeletal: Normal range of motion. She exhibits edema.       Peripheral edema no the level of the knees.  Non pitting no warmth erythema or induration.  Intact dorsalis pedis  Neurological: She is  alert and oriented to person, place, and time.  Skin: Skin is warm and dry. No rash noted. No erythema.  Psychiatric: She has a normal mood and affect.    ED Course  Procedures (including critical care time)   Labs Reviewed  CBC  I-STAT, CHEM 8  URINALYSIS, ROUTINE W REFLEX MICROSCOPIC  POCT PREGNANCY, URINE  D-DIMER, QUANTITATIVE   No results found.   No diagnosis found.   Date: 10/30/2011  Rate: 62  Rhythm: normal sinus rhythm  QRS Axis: normal  Intervals: normal  ST/T Wave abnormalities: normal  Conduction Disutrbances:none  Narrative Interpretation:   Old EKG Reviewed: none available    MDM  Case d/w Dr. Mingo Amber who will set up outpatient follow up for the patient.   Will place on CDU DVT protocol for dopplers in the am        Audriella Blakeley K Alandra Sando-Rasch, MD 10/30/11 0300  Seraya Jobst K Venus Gilles-Rasch, MD 10/30/11 [id number]

## 2011-10-30 NOTE — ED Provider Notes (Signed)
Medical screening examination/treatment/procedure(s) were performed by non-physician practitioner and as supervising physician I was immediately available for consultation/collaboration.  Ezequiel Essex, MD 10/30/11 [phone]

## 2011-10-30 NOTE — ED Notes (Signed)
Received patient from stretcher triage, patient complains of bilateral ankle swelling for one week, pedal pulses present, +2 edema

## 2011-10-30 NOTE — ED Notes (Signed)
Patient c/o swelling in feet and legs x 1 wk

## 2011-10-30 NOTE — Progress Notes (Signed)
Bilateral lower extremity venous duplex completed.  Preliminary report is negative for DVT, SVT, or Baker's cyst. Charlaine Dalton 10/30/2011, 11:44 AM

## 2011-10-30 NOTE — ED Notes (Signed)
pts sister very upset about the wait.  Pt moved to  [facility] room 3.  edp palumbo  Will see right now

## 2011-11-26 ENCOUNTER — Encounter: Payer: Self-pay | Admitting: Family Medicine

## 2011-11-26 ENCOUNTER — Ambulatory Visit (INDEPENDENT_AMBULATORY_CARE_PROVIDER_SITE_OTHER): Payer: Self-pay | Admitting: Family Medicine

## 2011-11-26 VITALS — BP 106/71 | HR 88 | Temp 98.6°F | Ht 62.0 in | Wt 104.2 lb

## 2011-11-26 DIAGNOSIS — R600 Localized edema: Secondary | ICD-10-CM | POA: Insufficient documentation

## 2011-11-26 DIAGNOSIS — R609 Edema, unspecified: Secondary | ICD-10-CM

## 2011-11-26 MED ORDER — TRAMADOL HCL 50 MG PO TABS: 50.0000 mg | ORAL_TABLET | Freq: Four times a day (QID) | ORAL | Status: DC | PRN

## 2011-11-26 NOTE — Progress Notes (Signed)
  Subjective:    Patient ID: Denise Alexander, female    DOB: [birth year], 40 y.o.   MRN: [id number]  HPI Pt with 1 month of LE edema and ankle pain.  The swelling preceded the pain by 1.5 weeks. She has not changed any meds and she stands at work but that has not changed.  She is buying suboxone on the street and taking it.  She has been "clean" of usual opiates x 1 year. Pt is still smoking 3/4 ppd.  She has been taking 848m of motrin q6 for 2-3 weeks.  She denies CP, SOB, HA, fever, pain in other joints. This has never happened before.  No known family history of rheumatic disease. The pain is worse in the evenings.  She went to the ED and had eval for DVT (negative dopplers and CTA).  These records were reviewed today.   Review of Systems    see above Objective:   Physical Exam Vital signs reviewed General appearance - alert, well appearing, and in no distress and oriented to person, place, and time Heart - normal rate, regular rhythm, normal S1, S2, no murmurs, rubs, clicks or gallops Chest - clear to auscultation, no wheezes, rales or rhonchi, symmetric air entry, no tachypnea, retractions or cyanosis Ext- bilaterally 1 plus edema to mid calf bilaterally.  [facility]  ROM near normal at B ankles but limited by pain.  Diffusely tender from toes to mid calf.  Toes are cool to the touch but have normal (<2 sec Cap refill)       Assessment & Plan:

## 2011-11-26 NOTE — Patient Instructions (Addendum)
I would like you to stop the ibuprofen  It can contribute to the swelling I would rather you just take tylenol because you are on suboxone  The other medicine that you are taking that can cause swelling is suboxone.   Please go for xrays at the hospital and make an appt with Dr. Valentina Lucks for ABIs (leg blood pressures)  Please make an appt to see me after you have had those appts

## 2011-11-26 NOTE — Assessment & Plan Note (Addendum)
1 month of LE edema and pain, first evaluation here.  Limitation in ROM of ankles due to pain so will check xrays however I do not think this is a joint problem.  I think this is edema due to her suboxone use, possibly compounded by her motrin use.  I could not feel pulses so I will send her for ABIs before I put her in comprssion hose.  I will also check a BNP today.  Reviewed ED labs (anemic and hypokalemic but otherwise normal with neg doppler and CTA)

## 2011-11-27 LAB — BRAIN NATRIURETIC PEPTIDE: Brain Natriuretic Peptide: 20.4 pg/mL (ref 0.0–100.0)

## 2011-12-04 ENCOUNTER — Encounter: Payer: Self-pay | Admitting: Pharmacist

## 2011-12-04 ENCOUNTER — Ambulatory Visit (INDEPENDENT_AMBULATORY_CARE_PROVIDER_SITE_OTHER): Payer: Self-pay | Admitting: Pharmacist

## 2011-12-04 DIAGNOSIS — R600 Localized edema: Secondary | ICD-10-CM

## 2011-12-04 DIAGNOSIS — R609 Edema, unspecified: Secondary | ICD-10-CM

## 2011-12-04 DIAGNOSIS — F17200 Nicotine dependence, unspecified, uncomplicated: Secondary | ICD-10-CM

## 2011-12-04 NOTE — Assessment & Plan Note (Signed)
Normal ABI and low likelihood of PAD based on ABI of 1.07 in a patient with symptoms of edema in legs, pain with walking . No  medication changes.  Results reviewed and written information provided.   F/U Clinic Visit with Dr. Lupita Raider.  Total time in face-to-face counseling 25 minutes.  Patient seen with Mahala Menghini, Pharmacy Resident

## 2011-12-04 NOTE — Assessment & Plan Note (Signed)
Encouraged smoking cessation; not currently ready to quit.

## 2011-12-04 NOTE — Progress Notes (Signed)
  Subjective:    Patient ID: Denise Alexander, female    DOB: 09-08-1971, 40 y.o.   MRN: [id number]  HPI reports pain with walking.  Pain is described as "knife" like/stabbing from her ankles to her knees which occurs with exercise and at rest.  Pain worsens when walking up hill or in a hurry. Reports pain when walking at an ordinary pace on a level surface   deniespain resolves on sitting.  Pain is localized to mainly in ankles and top of feet up to her knees. Works standing up for 12+ hours but this has not changed per the pt.  Denies personal history of CAD. Denies personal history of PAD. Denies personal history of amputation.    Review of Systems     Objective:   Physical Exam BP 114/74  Pulse 70  Ht 5' 1.5" (1.562 m)  Wt 103 lb (46.72 kg)  BMI 19.15 kg/m2  LMP 11/23/2011  Lower extremity Physical Exam includes 1 to 2+ pitting edema.  Warm to palpation, palpable pulses in feet, edema bilaterally   ABI overall = 1.07. Right Arm 116 mmHg    Left Arm 110 mmHg Right ankle posterior tibial 116 mmHg     dorsalis pedis 124 mmHg Left ankle posterior tibial 138 mmHg    dorsalis pedis 120 mmHg       Assessment & Plan:  Normal ABI and low likelihood of PAD based on ABI of 1.07 in a patient with symptoms of edema in legs, pain with walking . No  medication changes.  Results reviewed and written information provided.   F/U Clinic Visit with Dr. Lupita Raider.  Total time in face-to-face counseling 25 minutes.  Patient seen with Mahala Menghini, Pharmacy Resident. Marland Kitchen

## 2011-12-04 NOTE — Progress Notes (Signed)
  Subjective:    Patient ID: Denise Alexander, female    DOB: April 05, 1971, 40 y.o.   MRN: [id number]  HPI Reviewed and agree with Dr. Graylin Shiver management.    Review of Systems     Objective:   Physical Exam        Assessment & Plan:

## 2011-12-04 NOTE — Patient Instructions (Addendum)
Thank you for coming in today.  Your blood flow test is normal in your legs. Please continue with your follow up plans with Dr. Charlett Blake in January.

## 2011-12-24 ENCOUNTER — Ambulatory Visit (INDEPENDENT_AMBULATORY_CARE_PROVIDER_SITE_OTHER): Payer: Self-pay | Admitting: Family Medicine

## 2011-12-24 ENCOUNTER — Encounter: Payer: Self-pay | Admitting: Family Medicine

## 2011-12-24 VITALS — BP 109/72 | HR 84 | Temp 98.1°F | Ht 62.0 in | Wt 98.0 lb

## 2011-12-24 DIAGNOSIS — F17200 Nicotine dependence, unspecified, uncomplicated: Secondary | ICD-10-CM

## 2011-12-24 DIAGNOSIS — R600 Localized edema: Secondary | ICD-10-CM

## 2011-12-24 DIAGNOSIS — R609 Edema, unspecified: Secondary | ICD-10-CM

## 2011-12-24 DIAGNOSIS — F341 Dysthymic disorder: Secondary | ICD-10-CM

## 2011-12-24 MED ORDER — CITALOPRAM HYDROBROMIDE 40 MG PO TABS: 40.0000 mg | ORAL_TABLET | Freq: Every day | ORAL | Status: DC

## 2011-12-24 MED ORDER — CITALOPRAM HYDROBROMIDE 20 MG PO TABS: 40.0000 mg | ORAL_TABLET | Freq: Every day | ORAL | Status: DC

## 2011-12-24 NOTE — Assessment & Plan Note (Signed)
This is improved today. This may be due to her not using any more Motrin. This will probably get better when she stops Suboxone. Advised compression hose.

## 2011-12-24 NOTE — Progress Notes (Signed)
  Subjective:    Patient ID: Denise Alexander, female    DOB: Oct 21, 1971, 41 y.o.   MRN: [id number]  HPI Leg swelling-patient notes that her leg swelling is improving, especially in her feet. She stopped the Motrin completely. She saw Dr. Valentina Lucks who felt she had normal ABIs. She did not go to get her x-rays done. She has not changed any of her other medications.  Depression-patient notes that her son is getting into more trouble with the police and is using drugs. This is really increasing her stress. She says that she is losing weight even if she seems like she is eating the same amount of food. She says that in the past her weight has dropped with her mood. She states that she has had night sweats for the last 3 months of getting off her drugs. She denies any diarrhea, or abdominal pain. She has had a small cough since Christmas but has otherwise felt well. She was on Celexa in the past at 40 mg she felt she did better on this medicine. She has a diagnosis on her chart of bipolar however she has not endorsed any of the manic type symptoms today. She denies suicidal ideation or homicidal ideation  Tobacco abuse-patient reports today that she would like to quit smoking. She has not tried in the past and she does not have any idea what she would like to use. She previously saw Dr. Valentina Lucks and was very quit smoking however she would like to see him again for help. She states that she also the cigarette before she gets out of bed in the morning and that if she wakes up at night she'll smoke that as well.   Review of Systems Weight loss, night sweats. Denies fevers cough chills. Denies symptoms of mania    Objective:   Physical Exam Vital signs reviewed General appearance - alert, well appearing, and in no distress and oriented to person, place, and time Extremities - peripheral pulses normal, 1+ edema to mid calves bilaterally Psych-slightly depressed affect today. Tearful when discussing son. Otherwise good  eye contact, well dressed well groomed.        Assessment & Plan:

## 2011-12-24 NOTE — Assessment & Plan Note (Signed)
Patient has been treated on Celexa before and felt that she did well on this. I will restart this at 20 mg and increase to 40 after one week. Patient's weight loss may be due to depression as she's had weight loss with depression in the past. I will follow her in one month to make sure that she is doing better.

## 2011-12-24 NOTE — Patient Instructions (Signed)
It was nice to see you today. Am glad you're swelling is getting better. I would like you to get some compression hose from the drugstore and try this out. Lowering her dose of Suboxone will probably help too  For your depression, please start Celexa at 20 mg After one week please increase to 2 pills or 40 mg I have written for one month of the pills at 20 mg and after that you can fill the 40 mg pills.  I am so glad that you've decided to quit smoking. Please make an appointment to see Dr. Valentina Lucks to discuss this with him.

## 2011-12-24 NOTE — Assessment & Plan Note (Signed)
Patient is ready to quit. She smokes before she gets out of bed in the morning. I will send her to Dr. Valentina Lucks for help with this.

## 2011-12-31 ENCOUNTER — Ambulatory Visit: Payer: Self-pay | Admitting: Pharmacist

## 2012-01-26 ENCOUNTER — Ambulatory Visit (INDEPENDENT_AMBULATORY_CARE_PROVIDER_SITE_OTHER): Payer: Self-pay | Admitting: Family Medicine

## 2012-01-26 ENCOUNTER — Encounter: Payer: Self-pay | Admitting: Family Medicine

## 2012-01-26 DIAGNOSIS — F319 Bipolar disorder, unspecified: Secondary | ICD-10-CM

## 2012-01-26 MED ORDER — DIVALPROEX SODIUM 250 MG PO DR TAB: DELAYED_RELEASE_TABLET | ORAL | Status: DC

## 2012-01-26 MED ORDER — TRAZODONE HCL 50 MG PO TABS: 50.0000 mg | ORAL_TABLET | Freq: Every evening | ORAL | Status: DC | PRN

## 2012-01-26 MED ORDER — QUETIAPINE FUMARATE 100 MG PO TABS: ORAL_TABLET | ORAL | Status: DC

## 2012-01-26 NOTE — Progress Notes (Signed)
  Subjective:    Patient ID: Denise Alexander, female    DOB: 1970/12/27, 41 y.o.   MRN: [id number]  HPI Patient presents today for followup of her mood symptoms. She states that she is unable to sleep and is only getting 3 hours sleep at night. She lost her job recently and her son has a court date and may possibly be in jail. She feels that the Celexa is helping her but is not enough to control her mood. She is willing to see another provider to discuss these symptoms and is willing to speak with a social worker about any assistance that may be available. PHQ9 and MDQ performed.  Family history of psychiatric problems. Personal history of diagnosis of bipolar disorder, substance abuse. Current one pack per day smoker. Review of Systems Denies SI, HI    Objective:   Physical Exam Gen.-alert, tearful Psych-speaking at a normal pace, good eye contact. Tearful when speaking about the loss of her job or her son. Hopeful for treatment in the future. States that she does not have any family support and that she was alone.       Assessment & Plan:

## 2012-01-26 NOTE — Assessment & Plan Note (Signed)
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 Was treated with Depakote as well as Celexa at that time. She went off this medicine over one year ago and has not been any medicine since then. She was restarted on Celexa one month ago. She states this have help her depression but has not helped her manic symptoms. Her pHQ 9 today was score of 1 but her MDQ first part score of 7 part 2 is yes, and question 3 is serious problem. Patient was previously treated with Depakote, so we'll start this again. I will continue her Celexa for this time. Started with 250 in the a.m. and 500 at night. Patient was given instructions to call Dr. Gwenlyn Saran in mood disorder clinic today. Hopefully she'll be able to follow with Dr. Gwenlyn Saran and Dr. Tammi Klippel for this. Patient states she feels safe at home and will call this clinic or 911 if she is concerned.

## 2012-01-26 NOTE — Patient Instructions (Addendum)
I will ask our clinic social worker to give you a call. I recommend that you meet with our psychologist, Dr. Zella Ball, for help dealing with your emotions.  You can schedule an appointment with her by calling her directly at [phone].   Please call the clinic or 911 if you did not feel safe at home.   I would like to see you again in 2 weeks to make sure that you were doing okay. I would also like you to make an appointment for physical because you are due for Pap smear   I am starting Depakote today. Please call Dr. Gwenlyn Saran today to be seen and mood disorder clinic.

## 2012-01-28 ENCOUNTER — Telehealth: Payer: Self-pay | Admitting: Clinical

## 2012-01-29 ENCOUNTER — Telehealth: Payer: Self-pay | Admitting: Psychology

## 2012-01-29 NOTE — Telephone Encounter (Signed)
Error

## 2012-02-09 ENCOUNTER — Encounter: Payer: Self-pay | Admitting: Family Medicine

## 2012-02-16 NOTE — Telephone Encounter (Signed)
error 

## 2012-12-21 DIAGNOSIS — I219 Acute myocardial infarction, unspecified: Secondary | ICD-10-CM

## 2012-12-21 HISTORY — DX: Acute myocardial infarction, unspecified: I21.9

## 2014-09-28 ENCOUNTER — Emergency Department (HOSPITAL_BASED_OUTPATIENT_CLINIC_OR_DEPARTMENT_OTHER)
Admission: EM | Admit: 2014-09-28 | Discharge: 2014-09-28 | Disposition: A | Discharge status: Home / Self Care | Payer: Self-pay | Attending: Emergency Medicine | Admitting: Emergency Medicine

## 2014-09-28 ENCOUNTER — Encounter (HOSPITAL_BASED_OUTPATIENT_CLINIC_OR_DEPARTMENT_OTHER): Payer: Self-pay | Admitting: Emergency Medicine

## 2014-09-28 DIAGNOSIS — Z79899 Other long term (current) drug therapy: Secondary | ICD-10-CM | POA: Insufficient documentation

## 2014-09-28 DIAGNOSIS — K088 Other specified disorders of teeth and supporting structures: Secondary | ICD-10-CM | POA: Insufficient documentation

## 2014-09-28 DIAGNOSIS — Z720 Tobacco use: Secondary | ICD-10-CM | POA: Insufficient documentation

## 2014-09-28 DIAGNOSIS — F329 Major depressive disorder, single episode, unspecified: Secondary | ICD-10-CM | POA: Insufficient documentation

## 2014-09-28 DIAGNOSIS — K0381 Cracked tooth: Secondary | ICD-10-CM | POA: Insufficient documentation

## 2014-09-28 DIAGNOSIS — K0889 Other specified disorders of teeth and supporting structures: Secondary | ICD-10-CM

## 2014-09-28 HISTORY — DX: Depression, unspecified: F32.A

## 2014-09-28 HISTORY — DX: Major depressive disorder, single episode, unspecified: F32.9

## 2014-09-28 MED ORDER — IBUPROFEN 800 MG PO TABS: 800.0000 mg | ORAL_TABLET | Freq: Three times a day (TID) | ORAL | Status: DC

## 2014-09-28 MED ORDER — AMOXICILLIN 500 MG PO CAPS: 500.0000 mg | ORAL_CAPSULE | Freq: Three times a day (TID) | ORAL | Status: AC

## 2014-09-28 NOTE — ED Provider Notes (Signed)
CSN: [id number]     Arrival date & time 09/28/14  1315 History   First MD Initiated Contact with Patient 09/28/14 1350     Chief Complaint  Patient presents with  . Dental Pain     (Consider location/radiation/quality/duration/timing/severity/associated sxs/prior Treatment) Patient is a 43 y.o. female presenting with tooth pain. The history is provided by the patient. No language interpreter was used.  Dental Pain Location:  Upper Upper teeth location:  10/LU lateral incisor and 11/LU cuspid Quality:  Aching Severity:  Moderate Duration:  3 days Timing:  Constant Progression:  Worsening Chronicity:  New Context: not dental fracture   Relieved by:  Nothing Worsened by:  Nothing tried Ineffective treatments:  None tried Associated symptoms: no facial swelling   Risk factors: no alcohol problem     Past Medical History  Diagnosis Date  . Depression    History reviewed. No pertinent past surgical history. No family history on file. History  Substance Use Topics  . Smoking status: Current Every Day Smoker -- 0.80 packs/day    Types: Cigarettes  . Smokeless tobacco: Not on file     Comment: Started smoking at age 89.   Marland Kitchen Alcohol Use: No   OB History   Grav Para Term Preterm Abortions TAB SAB Ect Mult Living                 Review of Systems  HENT: Negative for facial swelling.   All other systems reviewed and are negative.     Allergies  Review of patient's allergies indicates no known allergies.  Home Medications   Prior to Admission medications   Medication Sig Start Date End Date Taking? Authorizing Provider  QUEtiapine (SEROQUEL) 200 MG tablet Take 200 mg by mouth at bedtime.   Yes Historical Provider, MD  ranitidine (ZANTAC) 150 MG capsule Take 150 mg by mouth 2 (two) times daily.   Yes Historical Provider, MD  amoxicillin (AMOXIL) 500 MG capsule Take 1 capsule (500 mg total) by mouth 3 (three) times daily. 09/28/14 10/08/14  Fransico Meadow, PA-C   citalopram (CELEXA) 40 MG tablet Take 1 tablet (40 mg total) by mouth daily. 12/24/11 12/23/12  Judithann Sheen, MD  divalproex (DEPAKOTE) 250 MG DR tablet Take 1 pill in a.m. and 2 pills in p.m. 01/26/12   Judithann Sheen, MD  ibuprofen (ADVIL,MOTRIN) 800 MG tablet Take 1 tablet (800 mg total) by mouth 3 (three) times daily. 09/28/14   Fransico Meadow, PA-C  Multiple Vitamins-Minerals (WOMENS MULTIVITAMIN PLUS PO) Take by mouth daily.      Historical Provider, MD  omeprazole (PRILOSEC) 40 MG capsule Take 40 mg by mouth daily. For reflux/stomach pain.     Historical Provider, MD  traMADol (ULTRAM) 50 MG tablet Take 50 mg by mouth every 6 (six) hours as needed. Maximum dose= 8 tablets per day. As needed  for pain.     Historical Provider, MD  traZODone (DESYREL) 50 MG tablet Take 1 tablet (50 mg total) by mouth at bedtime as needed for sleep. 01/26/12 02/25/12  Judithann Sheen, MD   BP 117/80  Pulse 92  Temp(Src) 98.2 F (36.8 C) (Oral)  Resp 20  Ht 5' 2"  (1.575 m)  Wt 97 lb 2 oz (44.056 kg)  BMI 17.76 kg/m2  SpO2 100% Physical Exam  Nursing note and vitals reviewed. Constitutional: She is oriented to person, place, and time. She appears well-developed and well-nourished.  HENT:  Head: Normocephalic and atraumatic.  Broken  left upper tooth,  No swelling,  Eyes: EOM are normal.  Neck: Normal range of motion.  Pulmonary/Chest: Effort normal.  Abdominal: She exhibits no distension.  Musculoskeletal: Normal range of motion.  Neurological: She is alert and oriented to person, place, and time.  Psychiatric: She has a normal mood and affect.    ED Course  Procedures (including critical care time) Labs Review Labs Reviewed - No data to display  Imaging Review No results found.   EKG Interpretation None      MDM   Final diagnoses:  Toothache    Amoxicillian Ibuprofen See Dr. Geralynn Ochs for evaluation. AVS    Fransico Meadow, PA-C 09/28/14 [place], [place] 09/28/14  1421

## 2014-09-28 NOTE — ED Notes (Signed)
Patient states she has had a toothache on the left upper jaw for the last three days.  Using ibuprofen and orajel with minimal relief.  Has broken tooth on the same side.

## 2014-09-28 NOTE — ED Provider Notes (Signed)
Medical screening examination/treatment/procedure(s) were performed by non-physician practitioner and as supervising physician I was immediately available for consultation/collaboration.   EKG Interpretation None        Evelina Bucy, MD 09/28/14 1427

## 2014-09-28 NOTE — Discharge Instructions (Signed)

## 2014-10-19 ENCOUNTER — Encounter (HOSPITAL_BASED_OUTPATIENT_CLINIC_OR_DEPARTMENT_OTHER): Payer: Self-pay | Admitting: Emergency Medicine

## 2014-10-19 ENCOUNTER — Emergency Department (HOSPITAL_BASED_OUTPATIENT_CLINIC_OR_DEPARTMENT_OTHER)
Admission: EM | Admit: 2014-10-19 | Discharge: 2014-10-19 | Disposition: A | Discharge status: Home / Self Care | Payer: Self-pay | Attending: Emergency Medicine | Admitting: Emergency Medicine

## 2014-10-19 DIAGNOSIS — F329 Major depressive disorder, single episode, unspecified: Secondary | ICD-10-CM | POA: Insufficient documentation

## 2014-10-19 DIAGNOSIS — Z79899 Other long term (current) drug therapy: Secondary | ICD-10-CM | POA: Insufficient documentation

## 2014-10-19 DIAGNOSIS — Z720 Tobacco use: Secondary | ICD-10-CM | POA: Insufficient documentation

## 2014-10-19 DIAGNOSIS — Z3202 Encounter for pregnancy test, result negative: Secondary | ICD-10-CM | POA: Insufficient documentation

## 2014-10-19 DIAGNOSIS — D509 Iron deficiency anemia, unspecified: Secondary | ICD-10-CM | POA: Insufficient documentation

## 2014-10-19 HISTORY — DX: Other psychoactive substance abuse, uncomplicated: F19.10

## 2014-10-19 LAB — CBC WITH DIFFERENTIAL/PLATELET
BASOS PCT: 0 % (ref 0–1)
Basophils Absolute: 0 10*3/uL (ref 0.0–0.1)
EOS ABS: 0.2 10*3/uL (ref 0.0–0.7)
EOS PCT: 3 % (ref 0–5)
HEMATOCRIT: 25.6 % — AB (ref 36.0–46.0)
HEMOGLOBIN: 7.1 g/dL — AB (ref 12.0–15.0)
Lymphocytes Relative: 39 % (ref 12–46)
Lymphs Abs: 2.3 10*3/uL (ref 0.7–4.0)
MCH: 16.9 pg — AB (ref 26.0–34.0)
MCHC: 27.7 g/dL — AB (ref 30.0–36.0)
MCV: 61.1 fL — ABNORMAL LOW (ref 78.0–100.0)
MONO ABS: 0.4 10*3/uL (ref 0.1–1.0)
Monocytes Relative: 7 % (ref 3–12)
NEUTROS ABS: 3.1 10*3/uL (ref 1.7–7.7)
NEUTROS PCT: 51 % (ref 43–77)
Platelets: 421 10*3/uL — ABNORMAL HIGH (ref 150–400)
RBC: 4.19 MIL/uL (ref 3.87–5.11)
RDW: 22 % — ABNORMAL HIGH (ref 11.5–15.5)
WBC: 6 10*3/uL (ref 4.0–10.5)

## 2014-10-19 LAB — URINALYSIS, ROUTINE W REFLEX MICROSCOPIC
BILIRUBIN URINE: NEGATIVE
Glucose, UA: NEGATIVE mg/dL
HGB URINE DIPSTICK: NEGATIVE
Ketones, ur: NEGATIVE mg/dL
Leukocytes, UA: NEGATIVE
NITRITE: NEGATIVE
PROTEIN: NEGATIVE mg/dL
Specific Gravity, Urine: 1.022 (ref 1.005–1.030)
UROBILINOGEN UA: 0.2 mg/dL (ref 0.0–1.0)
pH: 6 (ref 5.0–8.0)

## 2014-10-19 LAB — COMPREHENSIVE METABOLIC PANEL
ALK PHOS: 79 U/L (ref 39–117)
ALT: 48 U/L — AB (ref 0–35)
ANION GAP: 13 (ref 5–15)
AST: 53 U/L — ABNORMAL HIGH (ref 0–37)
Albumin: 3.1 g/dL — ABNORMAL LOW (ref 3.5–5.2)
BUN: 13 mg/dL (ref 6–23)
CALCIUM: 8.8 mg/dL (ref 8.4–10.5)
CO2: 23 meq/L (ref 19–32)
Chloride: 105 mEq/L (ref 96–112)
Creatinine, Ser: 0.5 mg/dL (ref 0.50–1.10)
GLUCOSE: 93 mg/dL (ref 70–99)
POTASSIUM: 4.7 meq/L (ref 3.7–5.3)
SODIUM: 141 meq/L (ref 137–147)
Total Bilirubin: <0.2 mg/dL — ABNORMAL LOW (ref 0.3–1.2)
Total Protein: 7.1 g/dL (ref 6.0–8.3)

## 2014-10-19 LAB — PREGNANCY, URINE: Preg Test, Ur: NEGATIVE

## 2014-10-19 MED ORDER — FERROUS SULFATE 325 (65 FE) MG PO TABS: 325.0000 mg | ORAL_TABLET | Freq: Two times a day (BID) | ORAL | Status: DC

## 2014-10-19 NOTE — ED Provider Notes (Signed)
Medical screening examination/treatment/procedure(s) were conducted as a shared visit with non-physician practitioner(s) and myself.  I personally evaluated the patient during the encounter.   EKG Interpretation None        Sharyon Cable, MD 10/19/14 [id number]

## 2014-10-19 NOTE — Discharge Instructions (Signed)
Anemia, Nonspecific Anemia is a condition in which the concentration of red blood cells or hemoglobin in the blood is below normal. Hemoglobin is a substance in red blood cells that carries oxygen to the tissues of the body. Anemia results in not enough oxygen reaching these tissues.  CAUSES  Common causes of anemia include:   Excessive bleeding. Bleeding may be internal or external. This includes excessive bleeding from periods (in women) or from the intestine.   Poor nutrition.   Chronic kidney, thyroid, and liver disease.  Bone marrow disorders that decrease red blood cell production.  Cancer and treatments for cancer.  HIV, AIDS, and their treatments.  Spleen problems that increase red blood cell destruction.  Blood disorders.  Excess destruction of red blood cells due to infection, medicines, and autoimmune disorders. SIGNS AND SYMPTOMS   Minor weakness.   Dizziness.   Headache.  Palpitations.   Shortness of breath, especially with exercise.   Paleness.  Cold sensitivity.  Indigestion.  Nausea.  Difficulty sleeping.  Difficulty concentrating. Symptoms may occur suddenly or they may develop slowly.  DIAGNOSIS  Additional blood tests are often needed. These help your health care provider determine the best treatment. Your health care provider will check your stool for blood and look for other causes of blood loss.  TREATMENT  Treatment varies depending on the cause of the anemia. Treatment can include:   Supplements of iron, vitamin B12, or folic acid.   Hormone medicines.   A blood transfusion. This may be needed if blood loss is severe.   Hospitalization. This may be needed if there is significant continual blood loss.   Dietary changes.  Spleen removal. HOME CARE INSTRUCTIONS Keep all follow-up appointments. It often takes many weeks to correct anemia, and having your health care provider check on your condition and your response to  treatment is very important. SEEK IMMEDIATE MEDICAL CARE IF:   You develop extreme weakness, shortness of breath, or chest pain.   You become dizzy or have trouble concentrating.  You develop heavy vaginal bleeding.   You develop a rash.   You have bloody or black, tarry stools.   You faint.   You vomit up blood.   You vomit repeatedly.   You have abdominal pain.  You have a fever or persistent symptoms for more than 2-3 days.   You have a fever and your symptoms suddenly get worse.   You are dehydrated.  MAKE SURE YOU:  Understand these instructions.  Will watch your condition.  Will get help right away if you are not doing well or get worse. Document Released: 01/14/2005 Document Revised: 08/09/2013 Document Reviewed: 06/02/2013 [employer] Patient Information 2015 [employer]. This information is not intended to replace advice given to you by your health care provider. Make sure you discuss any questions you have with your health care provider.  

## 2014-10-19 NOTE — ED Provider Notes (Signed)
Patient seen/examined in the Emergency Department in conjunction with Midlevel Provider [place] Patient reports fatigue Exam : awake/alert, no distress Plan: appropriate for outpatient management/treatment of her anemia   Sharyon Cable, MD 10/19/14 1345

## 2014-10-19 NOTE — ED Notes (Signed)
Fatigue for "months".  Labwork yesterday and call told hgb 6.9.

## 2014-10-19 NOTE — ED Provider Notes (Signed)
CSN: [id number]     Arrival date & time 10/19/14  1129 History   First MD Initiated Contact with Patient 10/19/14 1206     Chief Complaint  Patient presents with  . Fatigue     (Consider location/radiation/quality/duration/timing/severity/associated sxs/prior Treatment) Patient is a 43 y.o. female presenting with weakness. The history is provided by the patient. No language interpreter was used.  Weakness This is a new problem. Associated symptoms include weakness. Pertinent negatives include no chills, fever or nausea. Associated symptoms comments: She presents with complaint of generalized weakness and fatigue that has been ongoing for months. She is currently in [facility] for substance abuse and started on medications that required blood testing which was done yesterday. She presents today after the results of the lab work showed a hemoglobin of less than 7.0. She denies history of anemia or past transfusions. She does not have heavy menses and denies melena. No hematemesis. She denies pain, syncope or near syncope. .    Past Medical History  Diagnosis Date  . Depression   . Substance abuse    Past Surgical History  Procedure Laterality Date  . Tubal ligation     No family history on file. History  Substance Use Topics  . Smoking status: Current Every Day Smoker -- 0.50 packs/day    Types: Cigarettes  . Smokeless tobacco: Not on file     Comment: Started smoking at age 40.   Marland Kitchen Alcohol Use: No   OB History   Grav Para Term Preterm Abortions TAB SAB Ect Mult Living                 Review of Systems  Constitutional: Negative for fever and chills.  HENT: Negative.   Respiratory: Negative.   Cardiovascular: Negative.   Gastrointestinal: Negative.  Negative for nausea.  Musculoskeletal: Negative.   Skin: Negative.   Neurological: Positive for weakness. Negative for syncope.      Allergies  Review of patient's allergies indicates no known  allergies.  Home Medications   Prior to Admission medications   Medication Sig Start Date End Date Taking? Authorizing Provider  citalopram (CELEXA) 40 MG tablet Take 1 tablet (40 mg total) by mouth daily. 12/24/11 12/23/12  Judithann Sheen, MD  divalproex (DEPAKOTE) 250 MG DR tablet Take 1 pill in a.m. and 2 pills in p.m. 01/26/12   Judithann Sheen, MD  ibuprofen (ADVIL,MOTRIN) 800 MG tablet Take 1 tablet (800 mg total) by mouth 3 (three) times daily. 09/28/14   Fransico Meadow, PA-C  Multiple Vitamins-Minerals (WOMENS MULTIVITAMIN PLUS PO) Take by mouth daily.      Historical Provider, MD  omeprazole (PRILOSEC) 40 MG capsule Take 40 mg by mouth daily. For reflux/stomach pain.     Historical Provider, MD  QUEtiapine (SEROQUEL) 200 MG tablet Take 200 mg by mouth at bedtime.    Historical Provider, MD  ranitidine (ZANTAC) 150 MG capsule Take 150 mg by mouth 2 (two) times daily.    Historical Provider, MD  traMADol (ULTRAM) 50 MG tablet Take 50 mg by mouth every 6 (six) hours as needed. Maximum dose= 8 tablets per day. As needed  for pain.     Historical Provider, MD  traZODone (DESYREL) 50 MG tablet Take 1 tablet (50 mg total) by mouth at bedtime as needed for sleep. 01/26/12 02/25/12  Judithann Sheen, MD   BP 119/76  Pulse 97  Temp(Src) 98.5 F (36.9 C) (Oral)  Resp 18  Ht 5'  2" (1.575 m)  Wt 110 lb (49.896 kg)  BMI 20.11 kg/m2  SpO2 98% Physical Exam  Constitutional: She is oriented to person, place, and time. She appears well-developed and well-nourished.  HENT:  Head: Normocephalic.  Eyes:  Mild conjunctival pallor.  Neck: Normal range of motion. Neck supple.  Cardiovascular: Normal rate, regular rhythm and intact distal pulses.   Pulmonary/Chest: Effort normal and breath sounds normal.  Abdominal: Soft. Bowel sounds are normal. There is no tenderness. There is no rebound and no guarding.  Musculoskeletal: Normal range of motion.  Neurological: She is alert and oriented to person,  place, and time.  Skin: Skin is warm and dry. No rash noted.  Psychiatric: She has a normal mood and affect.    ED Course  Procedures (including critical care time) Labs Review Labs Reviewed  CBC WITH DIFFERENTIAL  COMPREHENSIVE METABOLIC PANEL  URINALYSIS, ROUTINE W REFLEX MICROSCOPIC  PREGNANCY, URINE   Results for orders placed during the hospital encounter of 10/19/14  CBC WITH DIFFERENTIAL      Result Value Ref Range   WBC 6.0  4.0 - 10.5 K/uL   RBC 4.19  3.87 - 5.11 MIL/uL   Hemoglobin 7.1 (*) 12.0 - 15.0 g/dL   HCT 25.6 (*) 36.0 - 46.0 %   MCV 61.1 (*) 78.0 - 100.0 fL   MCH 16.9 (*) 26.0 - 34.0 pg   MCHC 27.7 (*) 30.0 - 36.0 g/dL   RDW 22.0 (*) 11.5 - 15.5 %   Platelets 421 (*) 150 - 400 K/uL   Neutrophils Relative % 51  43 - 77 %   Lymphocytes Relative 39  12 - 46 %   Monocytes Relative 7  3 - 12 %   Eosinophils Relative 3  0 - 5 %   Basophils Relative 0  0 - 1 %   Neutro Abs 3.1  1.7 - 7.7 K/uL   Lymphs Abs 2.3  0.7 - 4.0 K/uL   Monocytes Absolute 0.4  0.1 - 1.0 K/uL   Eosinophils Absolute 0.2  0.0 - 0.7 K/uL   Basophils Absolute 0.0  0.0 - 0.1 K/uL   RBC Morphology STOMATOCYTES    COMPREHENSIVE METABOLIC PANEL      Result Value Ref Range   Sodium 141  137 - 147 mEq/L   Potassium 4.7  3.7 - 5.3 mEq/L   Chloride 105  96 - 112 mEq/L   CO2 23  19 - 32 mEq/L   Glucose, Bld 93  70 - 99 mg/dL   BUN 13  6 - 23 mg/dL   Creatinine, Ser 0.50  0.50 - 1.10 mg/dL   Calcium 8.8  8.4 - 10.5 mg/dL   Total Protein 7.1  6.0 - 8.3 g/dL   Albumin 3.1 (*) 3.5 - 5.2 g/dL   AST 53 (*) 0 - 37 U/L   ALT 48 (*) 0 - 35 U/L   Alkaline Phosphatase 79  39 - 117 U/L   Total Bilirubin <0.2 (*) 0.3 - 1.2 mg/dL   GFR calc non Af Amer >90  >90 mL/min   GFR calc Af Amer >90  >90 mL/min   Anion gap 13  5 - 15  URINALYSIS, ROUTINE W REFLEX MICROSCOPIC      Result Value Ref Range   Color, Urine YELLOW  YELLOW   APPearance CLEAR  CLEAR   Specific Gravity, Urine 1.022  1.005 - 1.030   pH  6.0  5.0 - 8.0   Glucose, UA NEGATIVE  NEGATIVE mg/dL   Hgb urine dipstick NEGATIVE  NEGATIVE   Bilirubin Urine NEGATIVE  NEGATIVE   Ketones, ur NEGATIVE  NEGATIVE mg/dL   Protein, ur NEGATIVE  NEGATIVE mg/dL   Urobilinogen, UA 0.2  0.0 - 1.0 mg/dL   Nitrite NEGATIVE  NEGATIVE   Leukocytes, UA NEGATIVE  NEGATIVE  PREGNANCY, URINE      Result Value Ref Range   Preg Test, Ur NEGATIVE  NEGATIVE    Imaging Review No results found.   EKG Interpretation None      MDM   Final diagnoses:  None    1. Anemia, chronic  Anemia appears chronic in nature with decreased MCV of 61.1, normal vital signs and essentially asymptomatic presentation. She feels well. She is currently in a treatment center where iron supplement will be reinforced with normal diet, and will be due to repeat blood draw periodically to check for progress. Return precautions provided.     Dewaine Oats, PA-C 10/19/14 1324

## 2015-09-04 ENCOUNTER — Encounter (HOSPITAL_COMMUNITY): Payer: Self-pay | Admitting: Emergency Medicine

## 2015-09-04 ENCOUNTER — Emergency Department (HOSPITAL_COMMUNITY)
Admission: EM | Admit: 2015-09-04 | Discharge: 2015-09-04 | Disposition: A | Discharge status: Home / Self Care | Payer: Self-pay | Attending: Emergency Medicine | Admitting: Emergency Medicine

## 2015-09-04 ENCOUNTER — Emergency Department (HOSPITAL_COMMUNITY): Payer: Self-pay

## 2015-09-04 DIAGNOSIS — J40 Bronchitis, not specified as acute or chronic: Secondary | ICD-10-CM

## 2015-09-04 DIAGNOSIS — F329 Major depressive disorder, single episode, unspecified: Secondary | ICD-10-CM | POA: Insufficient documentation

## 2015-09-04 DIAGNOSIS — R51 Headache: Secondary | ICD-10-CM | POA: Insufficient documentation

## 2015-09-04 DIAGNOSIS — R52 Pain, unspecified: Secondary | ICD-10-CM

## 2015-09-04 DIAGNOSIS — M545 Low back pain: Secondary | ICD-10-CM | POA: Insufficient documentation

## 2015-09-04 DIAGNOSIS — R519 Headache, unspecified: Secondary | ICD-10-CM

## 2015-09-04 DIAGNOSIS — Z720 Tobacco use: Secondary | ICD-10-CM | POA: Insufficient documentation

## 2015-09-04 DIAGNOSIS — R059 Cough, unspecified: Secondary | ICD-10-CM

## 2015-09-04 DIAGNOSIS — Z79899 Other long term (current) drug therapy: Secondary | ICD-10-CM | POA: Insufficient documentation

## 2015-09-04 DIAGNOSIS — R0981 Nasal congestion: Secondary | ICD-10-CM

## 2015-09-04 DIAGNOSIS — R062 Wheezing: Secondary | ICD-10-CM

## 2015-09-04 DIAGNOSIS — J209 Acute bronchitis, unspecified: Secondary | ICD-10-CM | POA: Insufficient documentation

## 2015-09-04 DIAGNOSIS — R197 Diarrhea, unspecified: Secondary | ICD-10-CM | POA: Insufficient documentation

## 2015-09-04 DIAGNOSIS — R1110 Vomiting, unspecified: Secondary | ICD-10-CM | POA: Insufficient documentation

## 2015-09-04 DIAGNOSIS — M546 Pain in thoracic spine: Secondary | ICD-10-CM | POA: Insufficient documentation

## 2015-09-04 DIAGNOSIS — R05 Cough: Secondary | ICD-10-CM

## 2015-09-04 LAB — BASIC METABOLIC PANEL
ANION GAP: 7 (ref 5–15)
BUN: 9 mg/dL (ref 6–20)
CHLORIDE: 108 mmol/L (ref 101–111)
CO2: 24 mmol/L (ref 22–32)
Calcium: 8.4 mg/dL — ABNORMAL LOW (ref 8.9–10.3)
Creatinine, Ser: 0.5 mg/dL (ref 0.44–1.00)
Glucose, Bld: 97 mg/dL (ref 65–99)
POTASSIUM: 3.3 mmol/L — AB (ref 3.5–5.1)
SODIUM: 139 mmol/L (ref 135–145)

## 2015-09-04 LAB — CBC WITH DIFFERENTIAL/PLATELET
BASOS ABS: 0 10*3/uL (ref 0.0–0.1)
Basophils Relative: 0 %
EOS ABS: 0.1 10*3/uL (ref 0.0–0.7)
EOS PCT: 1 %
HCT: 35.8 % — ABNORMAL LOW (ref 36.0–46.0)
HEMOGLOBIN: 11.3 g/dL — AB (ref 12.0–15.0)
LYMPHS PCT: 24 %
Lymphs Abs: 2.1 10*3/uL (ref 0.7–4.0)
MCH: 24.9 pg — AB (ref 26.0–34.0)
MCHC: 31.6 g/dL (ref 30.0–36.0)
MCV: 79 fL (ref 78.0–100.0)
MONOS PCT: 7 %
Monocytes Absolute: 0.6 10*3/uL (ref 0.1–1.0)
NEUTROS ABS: 6 10*3/uL (ref 1.7–7.7)
Neutrophils Relative %: 69 %
PLATELETS: 364 10*3/uL (ref 150–400)
RBC: 4.53 MIL/uL (ref 3.87–5.11)
RDW: 18.7 % — ABNORMAL HIGH (ref 11.5–15.5)
WBC: 8.7 10*3/uL (ref 4.0–10.5)

## 2015-09-04 LAB — I-STAT TROPONIN, ED: TROPONIN I, POC: 0.02 ng/mL (ref 0.00–0.08)

## 2015-09-04 MED ORDER — ALBUTEROL SULFATE HFA 108 (90 BASE) MCG/ACT IN AERS: 2.0000 | INHALATION_SPRAY | RESPIRATORY_TRACT | Status: DC | PRN

## 2015-09-04 MED ORDER — PREDNISONE 20 MG PO TABS: ORAL_TABLET | ORAL | Status: DC

## 2015-09-04 MED ORDER — AZITHROMYCIN 250 MG PO TABS: ORAL_TABLET | ORAL | Status: DC

## 2015-09-04 MED ORDER — IPRATROPIUM BROMIDE 0.02 % IN SOLN
0.5000 mg | Freq: Once | RESPIRATORY_TRACT | Status: AC
  Administered 2015-09-04: 0.5 mg via RESPIRATORY_TRACT
  Filled 2015-09-04: qty 2.5

## 2015-09-04 MED ORDER — ALBUTEROL SULFATE (2.5 MG/3ML) 0.083% IN NEBU
5.0000 mg | INHALATION_SOLUTION | Freq: Once | RESPIRATORY_TRACT | Status: AC
  Administered 2015-09-04: 5 mg via RESPIRATORY_TRACT
  Filled 2015-09-04: qty 6

## 2015-09-04 MED ORDER — GUAIFENESIN ER 600 MG PO TB12: 600.0000 mg | ORAL_TABLET | Freq: Two times a day (BID) | ORAL | Status: DC | PRN

## 2015-09-04 MED ORDER — PREDNISONE 20 MG PO TABS
60.0000 mg | ORAL_TABLET | Freq: Once | ORAL | Status: AC
  Administered 2015-09-04: 60 mg via ORAL
  Filled 2015-09-04: qty 3

## 2015-09-04 NOTE — ED Provider Notes (Signed)
CSN: [id number]     Arrival date & time 09/04/15  1113 History   First MD Initiated Contact with Patient 09/04/15 1134     Chief Complaint  Patient presents with  . Cough  . Headache     (Consider location/radiation/quality/duration/timing/severity/associated sxs/prior Treatment) HPI Comments: Denise Alexander is a 44 y.o. female with a PMHx of depression and substance abuse, who presents to the ED with complaints of dry cough 1 week unrelieved with TheraFlu, Advil cold and flu, and Alka-Seltzer, and worsening at night. Associated symptoms include: 7/10 until low back pain additionally she reports a mild headache when she coughs, similar to prior headaches; one episode of posttussive emesis which was nonbloody and nonbilious; green rhinorrhea; sinus congestion; and wheezing. She also reports that she has chronic diarrhea, describing 3 episodes of daily loose to watery nonbloody stools, but she states that this is chronic in nature and unchanged. She has no history of asthma or COPD, but she admits to smoking half pack per day of cigarettes. She denies any vision changes, diaphoresis, lightheadedness, ear pain or drainage, eye pain or drainage, sore throat, fevers, chills, chest pain, shortness breath, leg swelling, recent travel/surgery/immobilization, history of PE/DVT, abdominal pain, ongoing nausea, constipation, melena, hematochezia, obstipation, dysuria, hematuria, vaginal bleeding or discharge, numbness, tingling, weakness, or sick contacts.  Patient is a 44 y.o. female presenting with cough and headaches. The history is provided by the patient. No language interpreter was used.  Cough Cough characteristics:  Dry Severity:  Moderate Onset quality:  Gradual Duration:  1 week Timing:  Constant Progression:  Worsening Chronicity:  New Smoker: yes   Context: upper respiratory infection   Context: not sick contacts   Relieved by:  Nothing Exacerbated by: at night. Ineffective treatments:  theraflu, alka seltzer, advil cold and flu. Associated symptoms: headaches (intermittent, similar to prior, mostly with cough), rhinorrhea (green), sinus congestion and wheezing   Associated symptoms: no chest pain, no chills, no diaphoresis, no ear pain, no eye discharge, no fever, no myalgias, no shortness of breath and no sore throat   Risk factors: no recent travel   Headache Associated symptoms: back pain (mid-low), cough, diarrhea (chronic and unchanged, 3x/day loose watery stool), sinus pressure and vomiting (x1 posttussive)   Associated symptoms: no abdominal pain, no ear pain, no eye pain, no fever, no myalgias, no nausea, no numbness, no photophobia, no sore throat and no weakness     Past Medical History  Diagnosis Date  . Depression   . Substance abuse    Past Surgical History  Procedure Laterality Date  . Tubal ligation     No family history on file. Social History  Substance Use Topics  . Smoking status: Current Every Day Smoker -- 0.50 packs/day    Types: Cigarettes  . Smokeless tobacco: None     Comment: Started smoking at age 64.   Marland Kitchen Alcohol Use: No   OB History    No data available     Review of Systems  Constitutional: Negative for fever, chills and diaphoresis.  HENT: Positive for rhinorrhea (green) and sinus pressure. Negative for ear discharge, ear pain, sore throat and trouble swallowing.   Eyes: Negative for photophobia, pain, discharge, itching and visual disturbance.  Respiratory: Positive for cough and wheezing. Negative for shortness of breath.   Cardiovascular: Negative for chest pain and leg swelling.  Gastrointestinal: Positive for vomiting (x1 posttussive) and diarrhea (chronic and unchanged, 3x/day loose watery stool). Negative for nausea, abdominal pain and constipation.  Genitourinary: Negative for dysuria, hematuria, flank pain, vaginal bleeding and vaginal discharge.  Musculoskeletal: Positive for back pain (mid-low). Negative for myalgias  and arthralgias.  Skin: Negative for color change.  Allergic/Immunologic: Negative for immunocompromised state.  Neurological: Positive for headaches (intermittent, similar to prior, mostly with cough). Negative for weakness and numbness.  Psychiatric/Behavioral: Negative for confusion.   10 Systems reviewed and are negative for acute change except as noted in the HPI.    Allergies  Review of patient's allergies indicates no known allergies.  Home Medications   Prior to Admission medications   Medication Sig Start Date End Date Taking? Authorizing Provider  aspirin-sod bicarb-citric acid (ALKA-SELTZER) 325 MG TBEF tablet Take 325 mg by mouth every 6 (six) hours as needed (cold).   Yes Historical Provider, MD  Chlorphen-Pseudoephed-APAP (THERAFLU FLU/COLD PO) Take 1 tablet by mouth 2 (two) times daily.   Yes Historical Provider, MD  divalproex (DEPAKOTE) 250 MG DR tablet Take 1 pill in a.m. and 2 pills in p.m. 01/26/12  Yes Judithann Sheen, MD  ferrous sulfate 325 (65 FE) MG tablet Take 1 tablet (325 mg total) by mouth 2 (two) times daily with a meal. 10/19/14  Yes Charlann Lange, PA-C  Multiple Vitamins-Minerals (WOMENS MULTIVITAMIN PLUS PO) Take 1 tablet by mouth daily.    Yes Historical Provider, MD  Pseudoephedrine-Ibuprofen (ADVIL COLD/SINUS PO) Take 2 tablets by mouth every 4 (four) hours as needed (cold symptom).   Yes Historical Provider, MD  QUEtiapine (SEROQUEL) 200 MG tablet Take 200 mg by mouth at bedtime.   Yes Historical Provider, MD  ranitidine (ZANTAC) 150 MG capsule Take 150 mg by mouth 2 (two) times daily.   Yes Historical Provider, MD  citalopram (CELEXA) 40 MG tablet Take 1 tablet (40 mg total) by mouth daily. 12/24/11 12/23/12  Judithann Sheen, MD  ibuprofen (ADVIL,MOTRIN) 800 MG tablet Take 1 tablet (800 mg total) by mouth 3 (three) times daily. Patient not taking: Reported on 09/04/2015 09/28/14   Fransico Meadow, PA-C  traZODone (DESYREL) 50 MG tablet Take 1 tablet (50 mg  total) by mouth at bedtime as needed for sleep. 01/26/12 02/25/12  Judithann Sheen, MD   Triage VS: BP 103/70 mmHg  Pulse 108  Temp(Src) 98.1 F (36.7 C) (Oral)  Resp 17  Wt 108 lb (48.988 kg)  SpO2 93%  LMP 07/27/2015 Exam VS: BP 107/71 mmHg  Pulse 98  Temp(Src) 98.1 F (36.7 C) (Oral)  Resp 18  Wt 108 lb (48.988 kg)  SpO2 94%  LMP 07/27/2015  Physical Exam  Constitutional: She is oriented to person, place, and time. Vital signs are normal. She appears well-developed and well-nourished.  Non-toxic appearance. No distress.  Afebrile, nontoxic, NAD  HENT:  Head: Normocephalic and atraumatic.  Right Ear: Hearing, tympanic membrane, external ear and ear canal normal.  Left Ear: Hearing, tympanic membrane, external ear and ear canal normal.  Nose: Mucosal edema and rhinorrhea present. Right sinus exhibits maxillary sinus tenderness. Left sinus exhibits maxillary sinus tenderness.  Mouth/Throat: Uvula is midline, oropharynx is clear and moist and mucous membranes are normal. No trismus in the jaw. No uvula swelling.  Ears are clear bilaterally. Nose with mucosal edema and clear rhinorrhea, mild maxillary sinus tenderness. Oropharynx clear and moist, without uvular swelling or deviation, no trismus or drooling, no tonsillar swelling or erythema, no exudates.    Eyes: Conjunctivae and EOM are normal. Right eye exhibits no discharge. Left eye exhibits no discharge.  Neck: Normal range of  motion. Neck supple.  No meningismus  Cardiovascular: Normal rate, regular rhythm, normal heart sounds and intact distal pulses.  Exam reveals no gallop and no friction rub.   No murmur heard. RRR, nl s1/s2, no m/r/g, distal pulses intact, no pedal edema   Pulmonary/Chest: Effort normal. No respiratory distress. She has no decreased breath sounds. She has wheezes. She has rhonchi in the left lower field. She has no rales.  Diffuse expiratory wheezing with some rhonchi in LLF, no rales, no hypoxia or increased  WOB, speaking in full sentences, SpO2 94% on RA   Abdominal: Soft. Normal appearance and bowel sounds are normal. She exhibits no distension. There is no tenderness. There is no rigidity, no rebound, no guarding, no CVA tenderness, no tenderness at McBurney's point and negative Murphy's sign.  Musculoskeletal: Normal range of motion.       Thoracic back: She exhibits tenderness. She exhibits normal range of motion, no bony tenderness, no deformity and no spasm.       Lumbar back: She exhibits tenderness. She exhibits normal range of motion, no bony tenderness, no deformity and no spasm.       Back:  Lumbar and thoracic spine with FROM intact without spinous process TTP, no bony stepoffs or deformities, with mild diffuse b/l paraspinous muscle TTP without muscle spasms. Strength 5/5 in all extremities, sensation grossly intact in all extremities, negative SLR bilaterally, gait steady and nonantalgic. No overlying skin changes.   Neurological: She is alert and oriented to person, place, and time. She has normal strength. No sensory deficit.  No focal neuro deficits  Skin: Skin is warm, dry and intact. No rash noted.  Psychiatric: She has a normal mood and affect.  Nursing note and vitals reviewed.   ED Course  Procedures (including critical care time) Labs Review Labs Reviewed  CBC WITH DIFFERENTIAL/PLATELET - Abnormal; Notable for the following:    Hemoglobin 11.3 (*)    HCT 35.8 (*)    MCH 24.9 (*)    RDW 18.7 (*)    All other components within normal limits  BASIC METABOLIC PANEL - Abnormal; Notable for the following:    Potassium 3.3 (*)    Calcium 8.4 (*)    All other components within normal limits  Randolm Idol, ED    Imaging Review Dg Chest 2 View  09/04/2015   CLINICAL DATA:  Nonproductive cough. Mid and low back pain. Wheezing.  EXAM: CHEST  2 VIEW  COMPARISON:  CT scan dated 10/30/2011 and chest x-ray dated 11/28/2007  FINDINGS: Heart size and pulmonary vascularity are  normal. There is chronic or recurrent peribronchial thickening. Emphysematous changes are present in the upper lobes, best demonstrated on the prior CT scan.  There are no infiltrates or effusions.  No osseous abnormality.  IMPRESSION: 1. Emphysema. 2. Chronic or recurrent bronchitic changes.   Electronically Signed   By: Lorriane Shire M.D.   On: 09/04/2015 12:14   I have personally reviewed and evaluated these images and lab results as part of my medical decision-making.   EKG Interpretation None      MDM   Final diagnoses:  Bronchitis  Cough  Body aches  Wheezing  Nonintractable episodic headache, unspecified headache type  Sinus congestion  Tobacco abuse    44 y.o. female here with cough x1 week, nonproductive, and wheezing. Also has intermittent HA with coughing, sinus pressure and rhinorrhea, myalgias/back pain, and one episode of posttussive emesis. Also has chronic diarrhea/loose stool, unchanged from baseline. No  CP/SOB but given the mid/low back pain and episode of vomiting will obtain EKG to ensure no ischemic changes. On exam, mild diffuse b/l paraspinous muscle tenderness, expiratory wheezing, no focal neuro deficits, no hypoxia/tachycardia on my exam. Doubt PE or need for head imaging. Will get basic labs, CXR, and give nebs/prednisone. Pt declines wanting pain meds given that she's a recovering substance abuser. Will reassess shortly.   1:57 PM EKG unremarkable. Trop neg. CBC w/diff unremarkable. BMP with mildly low K, doubt need for repletion. CXR showing emphysematous changes and chronic or recurrent bronchitic changes. Pt is a smoker with no formal diagnosis of COPD but she likely has COPD. Will need formal PFT testing as outpt. Lung sounds improved after nebs. Will send home with inhaler, pred burst, and zpack. Will have her f/up with [place] in 1wk for recheck and to establish care. Discussed mucinex for expectoration and OTC meds for other URI symptoms. Smoking cessation  discussed. I explained the diagnosis and have given explicit precautions to return to the ER including for any other new or worsening symptoms. The patient understands and accepts the medical plan as it's been dictated and I have answered their questions. Discharge instructions concerning home care and prescriptions have been given. The patient is STABLE and is discharged to home in good condition.  BP 107/71 mmHg  Pulse 98  Temp(Src) 98.1 F (36.7 C) (Oral)  Resp 20  Wt 108 lb (48.988 kg)  SpO2 95%  LMP 07/27/2015  Meds ordered this encounter  Medications  . predniSONE (DELTASONE) tablet 60 mg    Sig:   . ipratropium (ATROVENT) nebulizer solution 0.5 mg    Sig:   . albuterol (PROVENTIL) (2.5 MG/3ML) 0.083% nebulizer solution 5 mg    Sig:   . azithromycin (ZITHROMAX Z-PAK) 250 MG tablet    Sig: 2 po day one, then 1 daily x 4 days    Dispense:  5 tablet    Refill:  0    Order Specific Question:  Supervising Provider    Answer:  MILLER, [place]  . predniSONE (DELTASONE) 20 MG tablet    Sig: 3 tabs po daily x 4 days    Dispense:  12 tablet    Refill:  0    Order Specific Question:  Supervising Provider    Answer:  MILLER, BRIAN [[id number]]  . albuterol (PROVENTIL HFA;VENTOLIN HFA) 108 (90 BASE) MCG/ACT inhaler    Sig: Inhale 2 puffs into the lungs every 2 (two) hours as needed for wheezing or shortness of breath (cough).    Dispense:  1 Inhaler    Refill:  0    Order Specific Question:  Supervising Provider    Answer:  MILLER, BRIAN [[id number]]  . guaiFENesin (MUCINEX) 600 MG 12 hr tablet    Sig: Take 1 tablet (600 mg total) by mouth 2 (two) times daily as needed for cough or to loosen phlegm.    Dispense:  10 tablet    Refill:  0    Order Specific Question:  Supervising Provider    Answer:  Noemi Chapel [[id number]]      Dalan Cowger Camprubi-Soms, PA-C 09/04/15 1359  Serita Grit, MD 09/06/15 1340

## 2015-09-04 NOTE — Discharge Instructions (Signed)
Continue to stay well-hydrated. Continue to alternate between Tylenol and Ibuprofen for pain or fever. Use inhaler as directed, as needed for cough/chest congestion. Take prednisone as directed. Use antibiotic as directed. Use Mucinex for cough suppression/expectoration of mucus. Use netipot and flonase to help with nasal congestion. May consider over-the-counter Benadryl or other antihistamine to decrease secretions and for watery itchy eyes. Followup with [place] [facility] in 5-7 days for recheck of ongoing symptoms and ongoing management of your possible COPD. STOP SMOKING! Return to emergency department for emergent changing or worsening of symptoms.    Chronic Obstructive Pulmonary Disease Chronic obstructive pulmonary disease (COPD) is a common lung condition in which airflow from the lungs is limited. COPD is a general term that can be used to describe many different lung problems that limit airflow, including both chronic bronchitis and emphysema. If you have COPD, your lung function will probably never return to normal, but there are measures you can take to improve lung function and make yourself feel better.  CAUSES   Smoking (common).   Exposure to secondhand smoke.   Genetic problems.  Chronic inflammatory lung diseases or recurrent infections. SYMPTOMS   Shortness of breath, especially with physical activity.   Deep, persistent (chronic) cough with a large amount of thick mucus.   Wheezing.   Rapid breaths (tachypnea).   Gray or bluish discoloration (cyanosis) of the skin, especially in fingers, toes, or lips.   Fatigue.   Weight loss.   Frequent infections or episodes when breathing symptoms become much worse (exacerbations).   Chest tightness. DIAGNOSIS  Your health care provider will take a medical history and perform a physical examination to make the initial diagnosis. Additional tests for COPD may include:   Lung (pulmonary) function  tests.  Chest X-ray.  CT scan.  Blood tests. TREATMENT  Treatment available to help you feel better when you have COPD includes:   Inhaler and nebulizer medicines. These help manage the symptoms of COPD and make your breathing more comfortable.  Supplemental oxygen. Supplemental oxygen is only helpful if you have a low oxygen level in your blood.   Exercise and physical activity. These are beneficial for nearly all people with COPD. Some people may also benefit from a pulmonary rehabilitation program. HOME CARE INSTRUCTIONS   Take all medicines (inhaled or pills) as directed by your health care provider.  Avoid over-the-counter medicines or cough syrups that dry up your airway (such as antihistamines) and slow down the elimination of secretions unless instructed otherwise by your health care provider.   If you are a smoker, the most important thing that you can do is stop smoking. Continuing to smoke will cause further lung damage and breathing trouble. Ask your health care provider for help with quitting smoking. He or she can direct you to community resources or hospitals that provide support.  Avoid exposure to irritants such as smoke, chemicals, and fumes that aggravate your breathing.  Use oxygen therapy and pulmonary rehabilitation if directed by your health care provider. If you require home oxygen therapy, ask your health care provider whether you should purchase a pulse oximeter to measure your oxygen level at home.   Avoid contact with individuals who have a contagious illness.  Avoid extreme temperature and humidity changes.  Eat healthy foods. Eating smaller, more frequent meals and resting before meals may help you maintain your strength.  Stay active, but balance activity with periods of rest. Exercise and physical activity will help you maintain your  ability to do things you want to do.  Preventing infection and hospitalization is very important when you have  COPD. Make sure to receive all the vaccines your health care provider recommends, especially the pneumococcal and influenza vaccines. Ask your health care provider whether you need a pneumonia vaccine.  Learn and use relaxation techniques to manage stress.  Learn and use controlled breathing techniques as directed by your health care provider. Controlled breathing techniques include:   Pursed lip breathing. Start by breathing in (inhaling) through your nose for 1 second. Then, purse your lips as if you were going to whistle and breathe out (exhale) through the pursed lips for 2 seconds.   Diaphragmatic breathing. Start by putting one hand on your abdomen just above your waist. Inhale slowly through your nose. The hand on your abdomen should move out. Then purse your lips and exhale slowly. You should be able to feel the hand on your abdomen moving in as you exhale.   Learn and use controlled coughing to clear mucus from your lungs. Controlled coughing is a series of short, progressive coughs. The steps of controlled coughing are:   Lean your head slightly forward.   Breathe in deeply using diaphragmatic breathing.   Try to hold your breath for 3 seconds.   Keep your mouth slightly open while coughing twice.   Spit any mucus out into a tissue.   Rest and repeat the steps once or twice as needed. SEEK MEDICAL CARE IF:   You are coughing up more mucus than usual.   There is a change in the color or thickness of your mucus.   Your breathing is more labored than usual.   Your breathing is faster than usual.  SEEK IMMEDIATE MEDICAL CARE IF:   You have shortness of breath while you are resting.   You have shortness of breath that prevents you from:  Being able to talk.   Performing your usual physical activities.   You have chest pain lasting longer than 5 minutes.   Your skin color is more cyanotic than usual.  You measure low oxygen saturations for longer  than 5 minutes with a pulse oximeter. MAKE SURE YOU:   Understand these instructions.  Will watch your condition.  Will get help right away if you are not doing well or get worse. Document Released: 09/16/2005 Document Revised: 04/23/2014 Document Reviewed: 08/03/2013 [facility] Patient Information 2015 [place], [place]. This information is not intended to replace advice given to you by your health care provider. Make sure you discuss any questions you have with your health care provider.  Cough, Adult  A cough is a reflex that helps clear your throat and airways. It can help heal the body or may be a reaction to an irritated airway. A cough may only last 2 or 3 weeks (acute) or may last more than 8 weeks (chronic).  CAUSES Acute cough:  Viral or bacterial infections. Chronic cough:  Infections.  Allergies.  Asthma.  Post-nasal drip.  Smoking.  Heartburn or acid reflux.  Some medicines.  Chronic lung problems (COPD).  Cancer. SYMPTOMS   Cough.  Fever.  Chest pain.  Increased breathing rate.  High-pitched whistling sound when breathing (wheezing).  Colored mucus that you cough up (sputum). TREATMENT   A bacterial cough may be treated with antibiotic medicine.  A viral cough must run its course and will not respond to antibiotics.  Your caregiver may recommend other treatments if you have a chronic cough. HOME CARE  INSTRUCTIONS   Only take over-the-counter or prescription medicines for pain, discomfort, or fever as directed by your caregiver. Use cough suppressants only as directed by your caregiver.  Use a cold steam vaporizer or humidifier in your bedroom or home to help loosen secretions.  Sleep in a semi-upright position if your cough is worse at night.  Rest as needed.  Stop smoking if you smoke. SEEK IMMEDIATE MEDICAL CARE IF:   You have pus in your sputum.  Your cough starts to worsen.  You cannot control your cough with suppressants and are  losing sleep.  You begin coughing up blood.  You have difficulty breathing.  You develop pain which is getting worse or is uncontrolled with medicine.  You have a fever. MAKE SURE YOU:   Understand these instructions.  Will watch your condition.  Will get help right away if you are not doing well or get worse. Document Released: 06/05/2011 Document Revised: 02/29/2012 Document Reviewed: 06/05/2011 [facility] Patient Information 2015 [place], [place]. This information is not intended to replace advice given to you by your health care provider. Make sure you discuss any questions you have with your health care provider.  Smoking Cessation, Tips for Success If you are ready to quit smoking, congratulations! You have chosen to help yourself be healthier. Cigarettes bring nicotine, tar, carbon monoxide, and other irritants into your body. Your lungs, heart, and blood vessels will be able to work better without these poisons. There are many different ways to quit smoking. Nicotine gum, nicotine patches, a nicotine inhaler, or nicotine nasal spray can help with physical craving. Hypnosis, support groups, and medicines help break the habit of smoking. WHAT THINGS CAN I DO TO MAKE QUITTING EASIER?  Here are some tips to help you quit for good:  Pick a date when you will quit smoking completely. Tell all of your friends and family about your plan to quit on that date.  Do not try to slowly cut down on the number of cigarettes you are smoking. Pick a quit date and quit smoking completely starting on that day.  Throw away all cigarettes.   Clean and remove all ashtrays from your home, work, and car.  On a card, write down your reasons for quitting. Carry the card with you and read it when you get the urge to smoke.  Cleanse your body of nicotine. Drink enough water and fluids to keep your urine clear or pale yellow. Do this after quitting to flush the nicotine from your body.  Learn to predict  your moods. Do not let a bad situation be your excuse to have a cigarette. Some situations in your life might tempt you into wanting a cigarette.  Never have "just one" cigarette. It leads to wanting another and another. Remind yourself of your decision to quit.  Change habits associated with smoking. If you smoked while driving or when feeling stressed, try other activities to replace smoking. Stand up when drinking your coffee. Brush your teeth after eating. Sit in a different chair when you read the paper. Avoid alcohol while trying to quit, and try to drink fewer caffeinated beverages. Alcohol and caffeine may urge you to smoke.  Avoid foods and drinks that can trigger a desire to smoke, such as sugary or spicy foods and alcohol.  Ask people who smoke not to smoke around you.  Have something planned to do right after eating or having a cup of coffee. For example, plan to take a walk or exercise.  Try a relaxation exercise to calm you down and decrease your stress. Remember, you may be tense and nervous for the first 2 weeks after you quit, but this will pass.  Find new activities to keep your hands busy. Play with a pen, coin, or rubber band. Doodle or draw things on paper.  Brush your teeth right after eating. This will help cut down on the craving for the taste of tobacco after meals. You can also try mouthwash.   Use oral substitutes in place of cigarettes. Try using lemon drops, carrots, cinnamon sticks, or chewing gum. Keep them handy so they are available when you have the urge to smoke.  When you have the urge to smoke, try deep breathing.  Designate your home as a nonsmoking area.  If you are a heavy smoker, ask your health care provider about a prescription for nicotine chewing gum. It can ease your withdrawal from nicotine.  Reward yourself. Set aside the cigarette money you save and buy yourself something nice.  Look for support from others. Join a support group or smoking  cessation program. Ask someone at home or at work to help you with your plan to quit smoking.  Always ask yourself, "Do I need this cigarette or is this just a reflex?" Tell yourself, "Today, I choose not to smoke," or "I do not want to smoke." You are reminding yourself of your decision to quit.  Do not replace cigarette smoking with electronic cigarettes (commonly called e-cigarettes). The safety of e-cigarettes is unknown, and some may contain harmful chemicals.  If you relapse, do not give up! Plan ahead and think about what you will do the next time you get the urge to smoke. HOW WILL I FEEL WHEN I QUIT SMOKING? You may have symptoms of withdrawal because your body is used to nicotine (the addictive substance in cigarettes). You may crave cigarettes, be irritable, feel very hungry, cough often, get headaches, or have difficulty concentrating. The withdrawal symptoms are only temporary. They are strongest when you first quit but will go away within 10-14 days. When withdrawal symptoms occur, stay in control. Think about your reasons for quitting. Remind yourself that these are signs that your body is healing and getting used to being without cigarettes. Remember that withdrawal symptoms are easier to treat than the major diseases that smoking can cause.  Even after the withdrawal is over, expect periodic urges to smoke. However, these cravings are generally short lived and will go away whether you smoke or not. Do not smoke! WHAT RESOURCES ARE AVAILABLE TO HELP ME QUIT SMOKING? Your health care provider can direct you to community resources or hospitals for support, which may include:  Group support.  Education.  Hypnosis.  Therapy. Document Released: 09/04/2004 Document Revised: 04/23/2014 Document Reviewed: 05/25/2013 [facility] Patient Information 2015 [place], [place]. This information is not intended to replace advice given to you by your health care provider. Make sure you discuss any  questions you have with your health care provider.

## 2015-09-04 NOTE — ED Notes (Signed)
Pt c/o cough x1 week, non productive, mid to lower bilateral back pain, N/V/D x3 days.

## 2015-09-04 NOTE — ED Notes (Signed)
Pt due prednisone but receiving nebulizer treatment at this time.

## 2015-09-23 ENCOUNTER — Ambulatory Visit: Payer: Self-pay | Admitting: Family Medicine

## 2017-04-14 ENCOUNTER — Emergency Department (HOSPITAL_COMMUNITY): Payer: Self-pay

## 2017-04-14 ENCOUNTER — Encounter (HOSPITAL_COMMUNITY): Payer: Self-pay

## 2017-04-14 ENCOUNTER — Emergency Department (HOSPITAL_COMMUNITY)
Admission: EM | Admit: 2017-04-14 | Discharge: 2017-04-14 | Disposition: A | Discharge status: Home / Self Care | Payer: Self-pay | Attending: Emergency Medicine | Admitting: Emergency Medicine

## 2017-04-14 DIAGNOSIS — J441 Chronic obstructive pulmonary disease with (acute) exacerbation: Secondary | ICD-10-CM | POA: Insufficient documentation

## 2017-04-14 DIAGNOSIS — Z7982 Long term (current) use of aspirin: Secondary | ICD-10-CM | POA: Insufficient documentation

## 2017-04-14 DIAGNOSIS — F17210 Nicotine dependence, cigarettes, uncomplicated: Secondary | ICD-10-CM | POA: Insufficient documentation

## 2017-04-14 HISTORY — DX: Chronic obstructive pulmonary disease, unspecified: J44.9

## 2017-04-14 LAB — POC URINE PREG, ED: PREG TEST UR: NEGATIVE

## 2017-04-14 MED ORDER — ALBUTEROL SULFATE (2.5 MG/3ML) 0.083% IN NEBU
INHALATION_SOLUTION | RESPIRATORY_TRACT | Status: AC
  Filled 2017-04-14: qty 3

## 2017-04-14 MED ORDER — DEXAMETHASONE 4 MG PO TABS
10.0000 mg | ORAL_TABLET | Freq: Once | ORAL | Status: AC
  Administered 2017-04-14: 10 mg via ORAL
  Filled 2017-04-14: qty 3

## 2017-04-14 MED ORDER — ALBUTEROL SULFATE HFA 108 (90 BASE) MCG/ACT IN AERS
2.0000 | INHALATION_SPRAY | Freq: Once | RESPIRATORY_TRACT | Status: AC
  Administered 2017-04-14: 2 via RESPIRATORY_TRACT
  Filled 2017-04-14: qty 6.7

## 2017-04-14 MED ORDER — ALBUTEROL SULFATE (2.5 MG/3ML) 0.083% IN NEBU
2.5000 mg | INHALATION_SOLUTION | Freq: Once | RESPIRATORY_TRACT | Status: AC
  Administered 2017-04-14: 2.5 mg via RESPIRATORY_TRACT

## 2017-04-14 NOTE — ED Notes (Signed)
Pt states that she smokes at least a half a pack to 2/3 a pack of cigarettes a day.

## 2017-04-14 NOTE — ED Triage Notes (Signed)
PT states she has been out of inhaler for about 5 days and has increased wheezing and difficulty taking deep breath. PT would like a refill for inhaler

## 2017-04-14 NOTE — ED Notes (Signed)
Pt states that she is hurting in flank area on both sides due to coughing so much.  Hx of COPD. States it hurts to take a deep breath.  Patient states that she is out of an inhaler and would like to get one to take home.

## 2017-04-14 NOTE — ED Provider Notes (Signed)
[facility] DEPT Provider Note   CSN: [id number] Arrival date & time: 04/14/17  1256  By signing my name below, I, Ethelle Lyon Long, attest that this documentation has been prepared under the direction and in the presence of Gareth Morgan, MD. Electronically Signed: Reinaldo Meeker, Scribe. 04/14/2017. 3:44 PM.  History   Chief Complaint Chief Complaint  Patient presents with  . Shortness of Breath   The history is provided by the patient and medical records. No language interpreter was used.    HPI Comments:  Denise Alexander is a 46 y.o. female with a PMHx of COPD, Depression, and Polysubstance Abuse (Heroin,Cocaine, Marijuana), who presents to the Emergency Department complaining of persistent, productive cough with phlegm onset five days ago. Pt reports running out of her inhaler five days ago, since that time she states she has had this persistent cough with consistent SOB that feels like her COPD. She has an additional associated symptom of wheezing and states if she had her inhaler, she feels like the SOB would be relieved. No h/o DVT/PE, recent surgeries, immobilizations, recent travel, or medication changes. Pt denies nausea, vomiting, fever, chills, rhinorrhea, sore throat, leg pain/swelling, CP, and any other complaints at this time.   Past Medical History:  Diagnosis Date  . COPD (chronic obstructive pulmonary disease) ([place])   . Depression   . Substance abuse    Patient Active Problem List   Diagnosis Date Noted  . Lower extremity edema 11/26/2011  . ANEMIA, IRON DEFICIENCY 07/24/2010  . ANEMIA 03/05/2010  . BIPOLAR DISORDER UNSPECIFIED 01/16/2009  . DEPRESSION/ANXIETY 12/17/2008  . TOBACCO ABUSE 12/17/2008  . GASTROESOPHAGEAL REFLUX DISEASE 12/17/2008   Past Surgical History:  Procedure Laterality Date  . TUBAL LIGATION     OB History    No data available     Home Medications    Prior to Admission medications   Medication Sig Start Date End Date Taking?  Authorizing Provider  albuterol (PROVENTIL HFA;VENTOLIN HFA) 108 (90 BASE) MCG/ACT inhaler Inhale 2 puffs into the lungs every 2 (two) hours as needed for wheezing or shortness of breath (cough). 09/04/15   Mercedes Street, PA-C  aspirin-sod bicarb-citric acid (ALKA-SELTZER) 325 MG TBEF tablet Take 325 mg by mouth every 6 (six) hours as needed (cold).    Historical Provider, MD  azithromycin (ZITHROMAX Z-PAK) 250 MG tablet 2 po day one, then 1 daily x 4 days 09/04/15   Mercedes Street, PA-C  Chlorphen-Pseudoephed-APAP [facility] FLU/COLD PO) Take 1 tablet by mouth 2 (two) times daily.    Historical Provider, MD  citalopram (CELEXA) 40 MG tablet Take 1 tablet (40 mg total) by mouth daily. 12/24/11 12/23/12  Judithann Sheen, MD  divalproex (DEPAKOTE) 250 MG DR tablet Take 1 pill in a.m. and 2 pills in p.m. 01/26/12   Judithann Sheen, MD  ferrous sulfate 325 (65 FE) MG tablet Take 1 tablet (325 mg total) by mouth 2 (two) times daily with a meal. 10/19/14   Charlann Lange, PA-C  guaiFENesin (MUCINEX) 600 MG 12 hr tablet Take 1 tablet (600 mg total) by mouth 2 (two) times daily as needed for cough or to loosen phlegm. 09/04/15   Mercedes Street, PA-C  ibuprofen (ADVIL,MOTRIN) 800 MG tablet Take 1 tablet (800 mg total) by mouth 3 (three) times daily. Patient not taking: Reported on 09/04/2015 09/28/14   Fransico Meadow, PA-C  Multiple Vitamins-Minerals (WOMENS MULTIVITAMIN PLUS PO) Take 1 tablet by mouth daily.     Historical Provider, MD  predniSONE (DELTASONE) 20 MG tablet 3 tabs po daily x 4 days 09/04/15   Mercedes Street, PA-C  Pseudoephedrine-Ibuprofen (ADVIL COLD/SINUS PO) Take 2 tablets by mouth every 4 (four) hours as needed (cold symptom).    Historical Provider, MD  QUEtiapine (SEROQUEL) 200 MG tablet Take 200 mg by mouth at bedtime.    Historical Provider, MD  ranitidine (ZANTAC) 150 MG capsule Take 150 mg by mouth 2 (two) times daily.    Historical Provider, MD  traZODone (DESYREL) 50 MG tablet Take 1  tablet (50 mg total) by mouth at bedtime as needed for sleep. 01/26/12 02/25/12  Judithann Sheen, MD   Family History No family history on file.  Social History Social History  Substance Use Topics  . Smoking status: Current Every Day Smoker    Packs/day: 0.50    Types: Cigarettes  . Smokeless tobacco: Never Used     Comment: Started smoking at age 82.   Marland Kitchen Alcohol use No   Allergies   Patient has no known allergies.   Review of Systems Review of Systems  Constitutional: Negative for chills and fever.  HENT: Negative for rhinorrhea and sore throat.   Respiratory: Positive for cough, shortness of breath and wheezing.   Cardiovascular: Negative for chest pain and leg swelling.  Gastrointestinal: Negative for nausea and vomiting.  Musculoskeletal: Negative for myalgias.     Physical Exam Updated Vital Signs BP (!) 136/102   Pulse 92   Temp 97.7 F (36.5 C) (Oral)   Resp 18   Ht 5' 2"  (1.575 m)   Wt 105 lb (47.6 kg)   LMP 03/31/2017   SpO2 100%   BMI 19.20 kg/m   Physical Exam  Constitutional: She is oriented to person, place, and time. She appears well-developed and well-nourished. No distress.  HENT:  Head: Normocephalic and atraumatic.  Eyes: Conjunctivae and EOM are normal.  Neck: Normal range of motion.  Cardiovascular: Normal rate, regular rhythm, normal heart sounds and intact distal pulses.  Exam reveals no gallop and no friction rub.   No murmur heard. Pulmonary/Chest: Effort normal. No respiratory distress. She has wheezes (end expiratory). She has no rales.  Abdominal: Soft. She exhibits no distension. There is no tenderness. There is no guarding.  Musculoskeletal: She exhibits no edema or tenderness.  Neurological: She is alert and oriented to person, place, and time.  Skin: Skin is warm and dry. No rash noted. She is not diaphoretic. No erythema.  Nursing note and vitals reviewed.    ED Treatments / Results  DIAGNOSTIC STUDIES:  Oxygen Saturation  is 100% on RA, normal by my interpretation.    COORDINATION OF CARE:  3:45 PM Discussed treatment plan with pt at bedside including CXR, steroids, and breathing Tx and pt agreed to plan.  Labs (all labs ordered are listed, but only abnormal results are displayed) Labs Reviewed  POC URINE PREG, ED    EKG  EKG Interpretation None       Radiology Dg Chest 2 View  Result Date: 04/14/2017 CLINICAL DATA:  Shortness of breath with cough for 3 weeks. Smoker with history of COPD. EXAM: CHEST  2 VIEW COMPARISON:  Radiographs 09/04/2015.  CT 10/30/2011. FINDINGS: The heart size and mediastinal contours are stable. There is stable mild chronic central airway thickening and mild upper lobe predominant emphysema. No superimposed airspace disease, edema, pleural effusion or pneumothorax demonstrated. The bones appear unchanged. Mildly prominent nipple shadows are noted bilaterally. IMPRESSION: Stable chronic lung disease consistent with  COPD/bronchitis. No acute superimposed findings. Electronically Signed   By: Richardean Sale M.D.   On: 04/14/2017 13:40    Procedures Procedures (including critical care time)  Medications Ordered in ED Medications  albuterol (PROVENTIL) (2.5 MG/3ML) 0.083% nebulizer solution (not administered)  albuterol (PROVENTIL HFA;VENTOLIN HFA) 108 (90 Base) MCG/ACT inhaler 2 puff (not administered)  dexamethasone (DECADRON) tablet 10 mg (not administered)  albuterol (PROVENTIL) (2.5 MG/3ML) 0.083% nebulizer solution 2.5 mg (2.5 mg Nebulization Given 04/14/17 1315)     Initial Impression / Assessment and Plan / ED Course  I have reviewed the triage vital signs and the nursing notes.  Pertinent labs & imaging results that were available during my care of the patient were reviewed by me and considered in my medical decision making (see chart for details).     46 year old female with history of COPD presents with concern for increased wheezing and cough since being out  of her albuterol inhaler 5 days ago. Patient reports she is going to  [facility] for treatment of alcohol and drug abuse, however was told that she needed her inhaler in order to enroll. Patient without significant dyspnea, with some end expiratory wheezing. Have low suspicion for pulmonary embolus. Chest x-ray shows no significant abnormalities. Given single dose of Decadron, albuterol nebulizer, and albuterol inhaler in the emergency department with improvement. She is discharged in stable condition with understanding of reasons to return.   Final Clinical Impressions(s) / ED Diagnoses   Final diagnoses:  COPD exacerbation ([place])    New Prescriptions New Prescriptions   No medications on file    I personally performed the services described in this documentation, which was scribed in my presence. The recorded information has been reviewed and is accurate.     Gareth Morgan, MD 04/14/17 1726

## 2017-04-14 NOTE — ED Notes (Signed)
Patient called x 2 with no answer

## 2017-05-04 ENCOUNTER — Encounter (HOSPITAL_BASED_OUTPATIENT_CLINIC_OR_DEPARTMENT_OTHER): Payer: Self-pay | Admitting: *Deleted

## 2017-05-04 ENCOUNTER — Emergency Department (HOSPITAL_BASED_OUTPATIENT_CLINIC_OR_DEPARTMENT_OTHER)
Admission: EM | Admit: 2017-05-04 | Discharge: 2017-05-04 | Disposition: A | Discharge status: Home / Self Care | Payer: Self-pay | Attending: Emergency Medicine | Admitting: Emergency Medicine

## 2017-05-04 DIAGNOSIS — Z87891 Personal history of nicotine dependence: Secondary | ICD-10-CM | POA: Insufficient documentation

## 2017-05-04 DIAGNOSIS — J449 Chronic obstructive pulmonary disease, unspecified: Secondary | ICD-10-CM | POA: Insufficient documentation

## 2017-05-04 DIAGNOSIS — K2900 Acute gastritis without bleeding: Secondary | ICD-10-CM | POA: Insufficient documentation

## 2017-05-04 DIAGNOSIS — Z79899 Other long term (current) drug therapy: Secondary | ICD-10-CM | POA: Insufficient documentation

## 2017-05-04 DIAGNOSIS — R1010 Upper abdominal pain, unspecified: Secondary | ICD-10-CM

## 2017-05-04 LAB — COMPREHENSIVE METABOLIC PANEL
ALK PHOS: 78 U/L (ref 38–126)
ALT: 50 U/L (ref 14–54)
AST: 54 U/L — AB (ref 15–41)
Albumin: 3.3 g/dL — ABNORMAL LOW (ref 3.5–5.0)
Anion gap: 8 (ref 5–15)
BUN: 16 mg/dL (ref 6–20)
CALCIUM: 8.7 mg/dL — AB (ref 8.9–10.3)
CHLORIDE: 104 mmol/L (ref 101–111)
CO2: 21 mmol/L — AB (ref 22–32)
CREATININE: 0.55 mg/dL (ref 0.44–1.00)
GFR calc Af Amer: >60 mL/min (ref >60)
Glucose, Bld: 99 mg/dL (ref 65–99)
Potassium: 3.9 mmol/L (ref 3.5–5.1)
Sodium: 133 mmol/L — ABNORMAL LOW (ref 135–145)
Total Bilirubin: 0.2 mg/dL — ABNORMAL LOW (ref 0.3–1.2)
Total Protein: 6.1 g/dL — ABNORMAL LOW (ref 6.5–8.1)

## 2017-05-04 LAB — URINALYSIS, ROUTINE W REFLEX MICROSCOPIC
BILIRUBIN URINE: NEGATIVE
Glucose, UA: NEGATIVE mg/dL
HGB URINE DIPSTICK: NEGATIVE
KETONES UR: NEGATIVE mg/dL
Leukocytes, UA: NEGATIVE
Nitrite: NEGATIVE
Protein, ur: NEGATIVE mg/dL
SPECIFIC GRAVITY, URINE: 1.02 (ref 1.005–1.030)
pH: 5 (ref 5.0–8.0)

## 2017-05-04 LAB — CBC
HCT: 33 % — ABNORMAL LOW (ref 36.0–46.0)
Hemoglobin: 9.9 g/dL — ABNORMAL LOW (ref 12.0–15.0)
MCH: 20.9 pg — AB (ref 26.0–34.0)
MCHC: 30 g/dL (ref 30.0–36.0)
MCV: 69.8 fL — AB (ref 78.0–100.0)
PLATELETS: 303 10*3/uL (ref 150–400)
RBC: 4.73 MIL/uL (ref 3.87–5.11)
RDW: 19.8 % — AB (ref 11.5–15.5)
WBC: 5.1 10*3/uL (ref 4.0–10.5)

## 2017-05-04 LAB — LIPASE, BLOOD: Lipase: 32 U/L (ref 11–51)

## 2017-05-04 LAB — PREGNANCY, URINE: PREG TEST UR: NEGATIVE

## 2017-05-04 MED ORDER — DIVALPROEX SODIUM 250 MG PO DR TAB: DELAYED_RELEASE_TABLET | ORAL | 0 refills | Status: DC

## 2017-05-04 MED ORDER — FAMOTIDINE 20 MG PO TABS
20.0000 mg | ORAL_TABLET | Freq: Once | ORAL | Status: AC
Start: 2017-05-04 — End: 2017-05-04
  Administered 2017-05-04: 20 mg via ORAL
  Filled 2017-05-04: qty 1

## 2017-05-04 MED ORDER — PANTOPRAZOLE SODIUM 40 MG PO TBEC: 40.0000 mg | DELAYED_RELEASE_TABLET | Freq: Every day | ORAL | 0 refills | Status: DC

## 2017-05-04 MED ORDER — ALUM & MAG HYDROXIDE-SIMETH 200-200-20 MG/5ML PO SUSP
15.0000 mL | Freq: Once | ORAL | Status: AC
  Administered 2017-05-04: 15 mL via ORAL
  Filled 2017-05-04: qty 30

## 2017-05-04 MED ORDER — CITALOPRAM HYDROBROMIDE 40 MG PO TABS: 40.0000 mg | ORAL_TABLET | Freq: Every day | ORAL | 0 refills | Status: DC

## 2017-05-04 NOTE — ED Provider Notes (Addendum)
[facility] DEPT [facility] Provider Note   CSN: [id number] Arrival date & time: 05/04/17  1049     History   Chief Complaint Chief Complaint  Patient presents with  . Abdominal Pain    HPI COY VANDOREN is a 46 y.o. female.  Patient c/o epigastric pain for the past month. Pain constant, dull, moderate, non radiating, worse after eating. Denies hx same pain. Denies hx pud, gallstones, or pancreatitis.  States has been in drug rehab program for 1 month. Denies hx etoh abuse. No back or flank pain. No dysuria or gu c/o. No lower abd or pelvic pain. No fever or chills. Denies hx chr gi illness.    The history is provided by the patient.  Abdominal Pain   Pertinent negatives include fever, diarrhea, vomiting, dysuria and headaches.    Past Medical History:  Diagnosis Date  . COPD (chronic obstructive pulmonary disease) ([place])   . Depression   . Substance abuse     Patient Active Problem List   Diagnosis Date Noted  . Lower extremity edema 11/26/2011  . ANEMIA, IRON DEFICIENCY 07/24/2010  . ANEMIA 03/05/2010  . BIPOLAR DISORDER UNSPECIFIED 01/16/2009  . DEPRESSION/ANXIETY 12/17/2008  . TOBACCO ABUSE 12/17/2008  . GASTROESOPHAGEAL REFLUX DISEASE 12/17/2008    Past Surgical History:  Procedure Laterality Date  . TUBAL LIGATION      OB History    No data available       Home Medications    Prior to Admission medications   Medication Sig Start Date End Date Taking? Authorizing Provider  albuterol (PROVENTIL HFA;VENTOLIN HFA) 108 (90 BASE) MCG/ACT inhaler Inhale 2 puffs into the lungs every 2 (two) hours as needed for wheezing or shortness of breath (cough). 09/04/15  Yes Street, Craig, PA-C  aspirin-sod bicarb-citric acid (ALKA-SELTZER) 325 MG TBEF tablet Take 325 mg by mouth every 6 (six) hours as needed (cold).    [provider]  azithromycin (ZITHROMAX Z-PAK) 250 MG tablet 2 po day one, then 1 daily x 4 days 09/04/15   Street, [place], PA-C    Chlorphen-Pseudoephed-APAP [facility] FLU/COLD PO) Take 1 tablet by mouth 2 (two) times daily.    [provider]  citalopram (CELEXA) 40 MG tablet Take 1 tablet (40 mg total) by mouth daily. 12/24/11 12/23/12  Judithann Sheen, MD  divalproex (DEPAKOTE) 250 MG DR tablet Take 1 pill in a.m. and 2 pills in p.m. 01/26/12   Judithann Sheen, MD  ferrous sulfate 325 (65 FE) MG tablet Take 1 tablet (325 mg total) by mouth 2 (two) times daily with a meal. 10/19/14   Upstill, Shari, PA-C  guaiFENesin (MUCINEX) 600 MG 12 hr tablet Take 1 tablet (600 mg total) by mouth 2 (two) times daily as needed for cough or to loosen phlegm. 09/04/15   Street, Mercedes, PA-C  ibuprofen (ADVIL,MOTRIN) 800 MG tablet Take 1 tablet (800 mg total) by mouth 3 (three) times daily. Patient not taking: Reported on 09/04/2015 09/28/14   Fransico Meadow, PA-C  Multiple Vitamins-Minerals (WOMENS MULTIVITAMIN PLUS PO) Take 1 tablet by mouth daily.     [provider]  predniSONE (DELTASONE) 20 MG tablet 3 tabs po daily x 4 days 09/04/15   Street, [place], PA-C  Pseudoephedrine-Ibuprofen (ADVIL COLD/SINUS PO) Take 2 tablets by mouth every 4 (four) hours as needed (cold symptom).    [provider]  QUEtiapine (SEROQUEL) 200 MG tablet Take 200 mg by mouth at bedtime.    [provider]  ranitidine (ZANTAC)  150 MG capsule Take 150 mg by mouth 2 (two) times daily.    [provider]  traZODone (DESYREL) 50 MG tablet Take 1 tablet (50 mg total) by mouth at bedtime as needed for sleep. 01/26/12 02/25/12  Judithann Sheen, MD    Family History No family history on file.  Social History Social History  Substance Use Topics  . Smoking status: Former Smoker    Packs/day: 0.50    Types: Cigarettes  . Smokeless tobacco: Never Used     Comment: Started smoking at age 78.   Marland Kitchen Alcohol use No     Allergies   Patient has no known allergies.   Review of Systems Review of Systems  Constitutional:  Negative for chills and fever.  HENT: Negative for sore throat.   Eyes: Negative for redness.  Respiratory: Negative for shortness of breath.   Cardiovascular: Negative for chest pain.  Gastrointestinal: Positive for abdominal pain. Negative for diarrhea and vomiting.  Genitourinary: Negative for dysuria, flank pain and pelvic pain.  Musculoskeletal: Negative for back pain and neck pain.  Skin: Negative for rash.  Neurological: Negative for headaches.  Hematological: Does not bruise/bleed easily.  Psychiatric/Behavioral: Negative for confusion.     Physical Exam Updated Vital Signs BP (!) 128/97 (BP Location: Left Arm)   Pulse 77   Temp 97.8 F (36.6 C) (Oral)   Resp (!) 22   Ht 5' 2"  (1.575 m)   Wt 45.6 kg   LMP 03/31/2017 (Approximate)   SpO2 100%   BMI 18.38 kg/m   Physical Exam  Constitutional: She appears well-developed and well-nourished. No distress.  Eyes: Conjunctivae are normal. No scleral icterus.  Neck: Neck supple. No tracheal deviation present.  Cardiovascular: Normal rate, regular rhythm, normal heart sounds and intact distal pulses.  Exam reveals no gallop and no friction rub.   No murmur heard. Pulmonary/Chest: Effort normal and breath sounds normal. No respiratory distress.  Abdominal: Soft. Normal appearance and bowel sounds are normal. She exhibits no distension and no mass. There is tenderness. There is no rebound and no guarding. No hernia.  Epigastric tenderness  Genitourinary:  Genitourinary Comments: No cva tenderness  Musculoskeletal: She exhibits no edema.  Neurological: She is alert.  Skin: Skin is warm and dry. No rash noted. She is not diaphoretic.  Psychiatric: She has a normal mood and affect.  Nursing note and vitals reviewed.    ED Treatments / Results  Labs (all labs ordered are listed, but only abnormal results are displayed) Results for orders placed or performed during the hospital encounter of 05/04/17  Urinalysis, Routine w  reflex microscopic  Result Value Ref Range   Color, Urine YELLOW YELLOW   APPearance CLEAR CLEAR   Specific Gravity, Urine 1.020 1.005 - 1.030   pH 5.0 5.0 - 8.0   Glucose, UA NEGATIVE NEGATIVE mg/dL   Hgb urine dipstick NEGATIVE NEGATIVE   Bilirubin Urine NEGATIVE NEGATIVE   Ketones, ur NEGATIVE NEGATIVE mg/dL   Protein, ur NEGATIVE NEGATIVE mg/dL   Nitrite NEGATIVE NEGATIVE   Leukocytes, UA NEGATIVE NEGATIVE  Pregnancy, urine  Result Value Ref Range   Preg Test, Ur NEGATIVE NEGATIVE  Comprehensive metabolic panel  Result Value Ref Range   Sodium 133 (L) 135 - 145 mmol/L   Potassium 3.9 3.5 - 5.1 mmol/L   Chloride 104 101 - 111 mmol/L   CO2 21 (L) 22 - 32 mmol/L   Glucose, Bld 99 65 - 99 mg/dL   BUN 16 6 -  20 mg/dL   Creatinine, Ser 0.55 0.44 - 1.00 mg/dL   Calcium 8.7 (L) 8.9 - 10.3 mg/dL   Total Protein 6.1 (L) 6.5 - 8.1 g/dL   Albumin 3.3 (L) 3.5 - 5.0 g/dL   AST 54 (H) 15 - 41 U/L   ALT 50 14 - 54 U/L   Alkaline Phosphatase 78 38 - 126 U/L   Total Bilirubin 0.2 (L) 0.3 - 1.2 mg/dL   GFR calc non Af Amer >60 >60 mL/min   GFR calc Af Amer >60 >60 mL/min   Anion gap 8 5 - 15  CBC  Result Value Ref Range   WBC 5.1 4.0 - 10.5 K/uL   RBC 4.73 3.87 - 5.11 MIL/uL   Hemoglobin 9.9 (L) 12.0 - 15.0 g/dL   HCT 33.0 (L) 36.0 - 46.0 %   MCV 69.8 (L) 78.0 - 100.0 fL   MCH 20.9 (L) 26.0 - 34.0 pg   MCHC 30.0 30.0 - 36.0 g/dL   RDW 19.8 (H) 11.5 - 15.5 %   Platelets 303 150 - 400 K/uL  Lipase, blood  Result Value Ref Range   Lipase 32 11 - 51 U/L   Dg Chest 2 View  Result Date: 04/14/2017 CLINICAL DATA:  Shortness of breath with cough for 3 weeks. Smoker with history of COPD. EXAM: CHEST  2 VIEW COMPARISON:  Radiographs 09/04/2015.  CT 10/30/2011. FINDINGS: The heart size and mediastinal contours are stable. There is stable mild chronic central airway thickening and mild upper lobe predominant emphysema. No superimposed airspace disease, edema, pleural effusion or pneumothorax  demonstrated. The bones appear unchanged. Mildly prominent nipple shadows are noted bilaterally. IMPRESSION: Stable chronic lung disease consistent with COPD/bronchitis. No acute superimposed findings. Electronically Signed   By: Richardean Sale M.D.   On: 04/14/2017 13:40    EKG  EKG Interpretation None       Radiology No results found.  Procedures Procedures (including critical care time)  Medications Ordered in ED Medications  famotidine (PEPCID) tablet 20 mg (not administered)  alum & mag hydroxide-simeth (MAALOX/MYLANTA) 200-200-20 MG/5ML suspension 15 mL (not administered)     Initial Impression / Assessment and Plan / ED Course  I have reviewed the triage vital signs and the nursing notes.  Pertinent labs & imaging results that were available during my care of the patient were reviewed by me and considered in my medical decision making (see chart for details).  Labs.   pepcid and maalox po.  Pt also requests refill of her celexa and depakote.     Final Clinical Impressions(s) / ED Diagnoses   Final diagnoses:  None    New Prescriptions New Prescriptions   No medications on file         Lajean Saver, MD 05/04/17 1326

## 2017-05-04 NOTE — Discharge Instructions (Signed)
It was our pleasure to provide your ER care today - we hope that you feel better.  Take protonix (acid blocker medication).  You may also try pepcid and maalox as need for symptom relief.  Follow up with primary care doctor in the next 1-2 weeks - discuss possible referral to gi specialist then if symptoms persist.   Return to ER if worse, new symptoms, severe abdominal pain, persistent vomiting, high fevers, other concern.

## 2017-05-04 NOTE — ED Notes (Signed)
[facility] being called by Nurse, KP for transport back to facility.

## 2017-05-04 NOTE — ED Notes (Signed)
Pt verbalized understanding of discharge instructions and denies any further questions at this time.   

## 2017-05-04 NOTE — ED Triage Notes (Signed)
Pt reports upper abd pain since going into [facility] on 04/14/2017 (pt is still currently at [facility]). Denies fever, n/v, genitourinary symptoms. Reports loose stools this morning (has been taking MOM for constipation).

## 2017-08-12 ENCOUNTER — Emergency Department: Payer: Self-pay

## 2017-08-12 ENCOUNTER — Encounter: Payer: Self-pay | Admitting: Emergency Medicine

## 2017-08-12 ENCOUNTER — Emergency Department
Admission: EM | Admit: 2017-08-12 | Discharge: 2017-08-12 | Disposition: A | Discharge status: Home / Self Care | Payer: Self-pay | Attending: Emergency Medicine | Admitting: Emergency Medicine

## 2017-08-12 DIAGNOSIS — F17210 Nicotine dependence, cigarettes, uncomplicated: Secondary | ICD-10-CM | POA: Insufficient documentation

## 2017-08-12 DIAGNOSIS — S40022A Contusion of left upper arm, initial encounter: Secondary | ICD-10-CM | POA: Insufficient documentation

## 2017-08-12 DIAGNOSIS — Y939 Activity, unspecified: Secondary | ICD-10-CM | POA: Insufficient documentation

## 2017-08-12 DIAGNOSIS — Y999 Unspecified external cause status: Secondary | ICD-10-CM | POA: Insufficient documentation

## 2017-08-12 DIAGNOSIS — Z79899 Other long term (current) drug therapy: Secondary | ICD-10-CM | POA: Insufficient documentation

## 2017-08-12 DIAGNOSIS — Y929 Unspecified place or not applicable: Secondary | ICD-10-CM | POA: Insufficient documentation

## 2017-08-12 DIAGNOSIS — J449 Chronic obstructive pulmonary disease, unspecified: Secondary | ICD-10-CM | POA: Insufficient documentation

## 2017-08-12 MED ORDER — TRAMADOL HCL 50 MG PO TABS: 50.0000 mg | ORAL_TABLET | Freq: Four times a day (QID) | ORAL | 0 refills | Status: DC | PRN

## 2017-08-12 MED ORDER — IBUPROFEN 600 MG PO TABS
600.0000 mg | ORAL_TABLET | Freq: Once | ORAL | Status: AC
  Administered 2017-08-12: 600 mg via ORAL
  Filled 2017-08-12: qty 1

## 2017-08-12 MED ORDER — TRAMADOL HCL 50 MG PO TABS
50.0000 mg | ORAL_TABLET | Freq: Once | ORAL | Status: AC
  Administered 2017-08-12: 50 mg via ORAL
  Filled 2017-08-12: qty 1

## 2017-08-12 MED ORDER — CYCLOBENZAPRINE HCL 10 MG PO TABS: 10.0000 mg | ORAL_TABLET | Freq: Three times a day (TID) | ORAL | 0 refills | Status: DC | PRN

## 2017-08-12 MED ORDER — IBUPROFEN 600 MG PO TABS: 600.0000 mg | ORAL_TABLET | Freq: Three times a day (TID) | ORAL | 0 refills | Status: DC | PRN

## 2017-08-12 MED ORDER — CYCLOBENZAPRINE HCL 10 MG PO TABS
10.0000 mg | ORAL_TABLET | Freq: Once | ORAL | Status: AC
  Administered 2017-08-12: 10 mg via ORAL
  Filled 2017-08-12: qty 1
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
## 2017-10-05 ENCOUNTER — Emergency Department: Payer: Self-pay

## 2017-10-05 ENCOUNTER — Emergency Department
Admission: EM | Admit: 2017-10-05 | Discharge: 2017-10-05 | Disposition: A | Discharge status: Home / Self Care | Payer: Self-pay | Attending: Emergency Medicine | Admitting: Emergency Medicine

## 2017-10-05 DIAGNOSIS — F17210 Nicotine dependence, cigarettes, uncomplicated: Secondary | ICD-10-CM | POA: Insufficient documentation

## 2017-10-05 DIAGNOSIS — Z79899 Other long term (current) drug therapy: Secondary | ICD-10-CM | POA: Insufficient documentation

## 2017-10-05 DIAGNOSIS — J189 Pneumonia, unspecified organism: Secondary | ICD-10-CM

## 2017-10-05 DIAGNOSIS — J449 Chronic obstructive pulmonary disease, unspecified: Secondary | ICD-10-CM | POA: Insufficient documentation

## 2017-10-05 DIAGNOSIS — J181 Lobar pneumonia, unspecified organism: Secondary | ICD-10-CM | POA: Insufficient documentation

## 2017-10-05 LAB — CBC
HEMATOCRIT: 29.9 % — AB (ref 35.0–47.0)
Hemoglobin: 9.3 g/dL — ABNORMAL LOW (ref 12.0–16.0)
MCH: 19.8 pg — AB (ref 26.0–34.0)
MCHC: 31.2 g/dL — AB (ref 32.0–36.0)
MCV: 63.3 fL — AB (ref 80.0–100.0)
Platelets: 470 10*3/uL — ABNORMAL HIGH (ref 150–440)
RBC: 4.73 MIL/uL (ref 3.80–5.20)
RDW: 19.3 % — AB (ref 11.5–14.5)
WBC: 8.8 10*3/uL (ref 3.6–11.0)

## 2017-10-05 LAB — BASIC METABOLIC PANEL
Anion gap: 6 (ref 5–15)
BUN: 10 mg/dL (ref 6–20)
CHLORIDE: 110 mmol/L (ref 101–111)
CO2: 22 mmol/L (ref 22–32)
Calcium: 7.8 mg/dL — ABNORMAL LOW (ref 8.9–10.3)
Creatinine, Ser: 0.51 mg/dL (ref 0.44–1.00)
GFR calc Af Amer: >60 mL/min (ref >60)
GFR calc non Af Amer: >60 mL/min (ref >60)
GLUCOSE: 104 mg/dL — AB (ref 65–99)
POTASSIUM: 3.1 mmol/L — AB (ref 3.5–5.1)
Sodium: 138 mmol/L (ref 135–145)

## 2017-10-05 LAB — TROPONIN I: Troponin I: <0.03 ng/mL (ref <0.03)

## 2017-10-05 MED ORDER — POTASSIUM CHLORIDE CRYS ER 20 MEQ PO TBCR
40.0000 meq | EXTENDED_RELEASE_TABLET | Freq: Once | ORAL | Status: AC
  Administered 2017-10-05: 40 meq via ORAL
  Filled 2017-10-05: qty 2

## 2017-10-05 MED ORDER — AZITHROMYCIN 250 MG PO TABS: 250.0000 mg | ORAL_TABLET | Freq: Every day | ORAL | 0 refills | Status: AC

## 2017-10-05 MED ORDER — ALBUTEROL SULFATE HFA 108 (90 BASE) MCG/ACT IN AERS: 2.0000 | INHALATION_SPRAY | Freq: Four times a day (QID) | RESPIRATORY_TRACT | 0 refills | Status: DC | PRN

## 2017-10-05 MED ORDER — AZITHROMYCIN 500 MG PO TABS
500.0000 mg | ORAL_TABLET | Freq: Once | ORAL | Status: AC
  Administered 2017-10-05: 500 mg via ORAL
  Filled 2017-10-05: qty 1

## 2017-10-05 MED ORDER — IPRATROPIUM-ALBUTEROL 0.5-2.5 (3) MG/3ML IN SOLN
3.0000 mL | Freq: Once | RESPIRATORY_TRACT | Status: AC
  Administered 2017-10-05: 3 mL via RESPIRATORY_TRACT
  Filled 2017-10-05: qty 3

## 2017-10-05 NOTE — ED Notes (Signed)

## 2017-10-05 NOTE — ED Triage Notes (Signed)
Patient reports having a fever for several days, right lower back pain for 2 weeks and chest pressure for 2 days.  Reports non productive cough.
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
## 2017-10-05 NOTE — Discharge Instructions (Signed)
Please seek medical attention for any high fevers, chest pain, shortness of breath, change in behavior, persistent vomiting, bloody stool or any other new or concerning symptoms.  

## 2017-12-02 ENCOUNTER — Inpatient Hospital Stay
Admission: EM | Admit: 2017-12-02 | Discharge: 2017-12-03 | DRG: 896 | Disposition: A | Discharge status: Home / Self Care | Payer: Self-pay | Attending: Internal Medicine | Admitting: Internal Medicine

## 2017-12-02 ENCOUNTER — Other Ambulatory Visit: Payer: Self-pay

## 2017-12-02 ENCOUNTER — Emergency Department: Payer: Self-pay

## 2017-12-02 DIAGNOSIS — R7989 Other specified abnormal findings of blood chemistry: Secondary | ICD-10-CM | POA: Diagnosis present

## 2017-12-02 DIAGNOSIS — I214 Non-ST elevation (NSTEMI) myocardial infarction: Secondary | ICD-10-CM | POA: Diagnosis present

## 2017-12-02 DIAGNOSIS — Z79899 Other long term (current) drug therapy: Secondary | ICD-10-CM

## 2017-12-02 DIAGNOSIS — F17210 Nicotine dependence, cigarettes, uncomplicated: Secondary | ICD-10-CM | POA: Diagnosis present

## 2017-12-02 DIAGNOSIS — I201 Angina pectoris with documented spasm: Secondary | ICD-10-CM | POA: Diagnosis present

## 2017-12-02 DIAGNOSIS — J449 Chronic obstructive pulmonary disease, unspecified: Secondary | ICD-10-CM | POA: Diagnosis present

## 2017-12-02 DIAGNOSIS — D509 Iron deficiency anemia, unspecified: Secondary | ICD-10-CM | POA: Diagnosis present

## 2017-12-02 DIAGNOSIS — F14188 Cocaine abuse with other cocaine-induced disorder: Principal | ICD-10-CM | POA: Diagnosis present

## 2017-12-02 DIAGNOSIS — Z23 Encounter for immunization: Secondary | ICD-10-CM

## 2017-12-02 DIAGNOSIS — I249 Acute ischemic heart disease, unspecified: Secondary | ICD-10-CM | POA: Diagnosis present

## 2017-12-02 DIAGNOSIS — Z880 Allergy status to penicillin: Secondary | ICD-10-CM

## 2017-12-02 DIAGNOSIS — F1410 Cocaine abuse, uncomplicated: Secondary | ICD-10-CM

## 2017-12-02 DIAGNOSIS — Z8249 Family history of ischemic heart disease and other diseases of the circulatory system: Secondary | ICD-10-CM

## 2017-12-02 DIAGNOSIS — F1910 Other psychoactive substance abuse, uncomplicated: Secondary | ICD-10-CM | POA: Diagnosis present

## 2017-12-02 DIAGNOSIS — F329 Major depressive disorder, single episode, unspecified: Secondary | ICD-10-CM | POA: Diagnosis present

## 2017-12-02 LAB — BASIC METABOLIC PANEL
ANION GAP: 7 (ref 5–15)
BUN: 12 mg/dL (ref 6–20)
CALCIUM: 8.3 mg/dL — AB (ref 8.9–10.3)
CO2: 21 mmol/L — AB (ref 22–32)
Chloride: 107 mmol/L (ref 101–111)
Creatinine, Ser: 0.63 mg/dL (ref 0.44–1.00)
GLUCOSE: 96 mg/dL (ref 65–99)
POTASSIUM: 4.9 mmol/L (ref 3.5–5.1)
Sodium: 135 mmol/L (ref 135–145)

## 2017-12-02 LAB — APTT: APTT: 34 s (ref 24–36)

## 2017-12-02 LAB — CBC
HEMATOCRIT: 35.1 % (ref 35.0–47.0)
Hemoglobin: 10.7 g/dL — ABNORMAL LOW (ref 12.0–16.0)
MCH: 19.8 pg — AB (ref 26.0–34.0)
MCHC: 30.5 g/dL — ABNORMAL LOW (ref 32.0–36.0)
MCV: 65 fL — AB (ref 80.0–100.0)
PLATELETS: 524 10*3/uL — AB (ref 150–440)
RBC: 5.4 MIL/uL — ABNORMAL HIGH (ref 3.80–5.20)
RDW: 21.8 % — ABNORMAL HIGH (ref 11.5–14.5)
WBC: 7.8 10*3/uL (ref 3.6–11.0)

## 2017-12-02 LAB — TROPONIN I
TROPONIN I: 1.68 ng/mL — AB (ref <0.03)
Troponin I: 0.07 ng/mL (ref <0.03)
Troponin I: 3.41 ng/mL (ref <0.03)

## 2017-12-02 LAB — PROTIME-INR
INR: 1.04
PROTHROMBIN TIME: 13.5 s (ref 11.4–15.2)

## 2017-12-02 LAB — POCT PREGNANCY, URINE: Preg Test, Ur: NEGATIVE

## 2017-12-02 MED ORDER — ACETAMINOPHEN 650 MG RE SUPP: 650.0000 mg | Freq: Four times a day (QID) | RECTAL | Status: DC | PRN

## 2017-12-02 MED ORDER — NITROGLYCERIN 0.4 MG SL SUBL
0.4000 mg | SUBLINGUAL_TABLET | SUBLINGUAL | Status: DC | PRN
  Administered 2017-12-02 (×3): 0.4 mg via SUBLINGUAL
  Filled 2017-12-02 (×2): qty 1

## 2017-12-02 MED ORDER — MORPHINE SULFATE (PF) 2 MG/ML IV SOLN: 2.0000 mg | INTRAVENOUS | Status: DC | PRN

## 2017-12-02 MED ORDER — INFLUENZA VAC SPLIT QUAD 0.5 ML IM SUSY
0.5000 mL | PREFILLED_SYRINGE | INTRAMUSCULAR | Status: AC
  Administered 2017-12-03: 0.5 mL via INTRAMUSCULAR
  Filled 2017-12-02: qty 0.5

## 2017-12-02 MED ORDER — HEPARIN (PORCINE) IN NACL 100-0.45 UNIT/ML-% IJ SOLN
600.0000 [IU]/h | INTRAMUSCULAR | Status: DC
  Administered 2017-12-02: 600 [IU]/h via INTRAVENOUS
  Filled 2017-12-02: qty 250

## 2017-12-02 MED ORDER — NITROGLYCERIN 0.4 MG SL SUBL
0.4000 mg | SUBLINGUAL_TABLET | SUBLINGUAL | Status: DC | PRN
  Administered 2017-12-02: 0.4 mg via SUBLINGUAL
  Filled 2017-12-02: qty 1

## 2017-12-02 MED ORDER — ATORVASTATIN CALCIUM 20 MG PO TABS
40.0000 mg | ORAL_TABLET | Freq: Every day | ORAL | Status: DC
  Administered 2017-12-02 – 2017-12-03 (×2): 40 mg via ORAL
  Filled 2017-12-02 (×2): qty 2

## 2017-12-02 MED ORDER — ACETAMINOPHEN 325 MG PO TABS: 650.0000 mg | ORAL_TABLET | Freq: Four times a day (QID) | ORAL | Status: DC | PRN

## 2017-12-02 MED ORDER — SODIUM CHLORIDE 0.9% FLUSH
3.0000 mL | Freq: Two times a day (BID) | INTRAVENOUS | Status: DC
  Administered 2017-12-02 – 2017-12-03 (×2): 3 mL via INTRAVENOUS

## 2017-12-02 MED ORDER — SODIUM CHLORIDE 0.9 % IV SOLN
INTRAVENOUS | Status: DC
  Administered 2017-12-02: 18:00:00 via INTRAVENOUS

## 2017-12-02 MED ORDER — ALBUTEROL SULFATE (2.5 MG/3ML) 0.083% IN NEBU: 2.5000 mg | INHALATION_SOLUTION | RESPIRATORY_TRACT | Status: DC | PRN

## 2017-12-02 MED ORDER — ASPIRIN EC 325 MG PO TBEC
325.0000 mg | DELAYED_RELEASE_TABLET | Freq: Every day | ORAL | Status: DC
  Administered 2017-12-03: 325 mg via ORAL
  Filled 2017-12-02: qty 1

## 2017-12-02 MED ORDER — ONDANSETRON HCL 4 MG PO TABS: 4.0000 mg | ORAL_TABLET | Freq: Four times a day (QID) | ORAL | Status: DC | PRN

## 2017-12-02 MED ORDER — ONDANSETRON HCL 4 MG/2ML IJ SOLN: 4.0000 mg | Freq: Four times a day (QID) | INTRAMUSCULAR | Status: DC | PRN

## 2017-12-02 MED ORDER — SODIUM CHLORIDE 0.9% FLUSH: 3.0000 mL | INTRAVENOUS | Status: DC | PRN

## 2017-12-02 MED ORDER — MORPHINE SULFATE (PF) 4 MG/ML IV SOLN
4.0000 mg | INTRAVENOUS | Status: DC | PRN
  Administered 2017-12-02 – 2017-12-03 (×5): 4 mg via INTRAVENOUS
  Filled 2017-12-02 (×5): qty 1

## 2017-12-02 MED ORDER — HYDROCODONE-ACETAMINOPHEN 5-325 MG PO TABS
1.0000 | ORAL_TABLET | ORAL | Status: DC | PRN
  Administered 2017-12-02: 1 via ORAL
  Administered 2017-12-02: 2 via ORAL
  Filled 2017-12-02: qty 1
  Filled 2017-12-02: qty 2

## 2017-12-02 MED ORDER — SENNOSIDES-DOCUSATE SODIUM 8.6-50 MG PO TABS: 1.0000 | ORAL_TABLET | Freq: Every evening | ORAL | Status: DC | PRN

## 2017-12-02 MED ORDER — HEPARIN SODIUM (PORCINE) 5000 UNIT/ML IJ SOLN
60.0000 [IU]/kg | Freq: Once | INTRAMUSCULAR | Status: AC
  Administered 2017-12-02: 3000 [IU] via INTRAVENOUS
  Filled 2017-12-02: qty 1

## 2017-12-02 MED ORDER — NICOTINE 21 MG/24HR TD PT24
21.0000 mg | MEDICATED_PATCH | Freq: Every day | TRANSDERMAL | Status: DC
  Administered 2017-12-02 – 2017-12-03 (×2): 21 mg via TRANSDERMAL
  Filled 2017-12-02 (×2): qty 1

## 2017-12-02 MED ORDER — ASPIRIN 81 MG PO CHEW
324.0000 mg | CHEWABLE_TABLET | Freq: Once | ORAL | Status: AC
  Administered 2017-12-02: 324 mg via ORAL
  Filled 2017-12-02: qty 4

## 2017-12-02 MED ORDER — SODIUM CHLORIDE 0.9 % IV SOLN: 250.0000 mL | INTRAVENOUS | Status: DC | PRN

## 2017-12-02 MED ORDER — BISACODYL 5 MG PO TBEC
5.0000 mg | DELAYED_RELEASE_TABLET | Freq: Every day | ORAL | Status: DC | PRN
Start: 2017-12-02 — End: 2017-12-03

## 2017-12-02 NOTE — Plan of Care (Signed)
Oriented to unit routines, room features, food service.

## 2017-12-02 NOTE — Consult Note (Signed)
[facility] Consultation Note  Patient ID: Denise Alexander, MRN: [id number], DOB/AGE: 1971-01-21 46 y.o. Admit date: 12/02/2017   Date of Consult: 12/02/2017 Primary Physician: Patient, No Pcp Per Primary Cardiologist: None  Chief Complaint:  Chief Complaint  Patient presents with  . Chest Pain   Reason for Consult: Chest pain  HPI: 46 y.o. female with known tobacco abuse with chronic obstructive pulmonary disease and a family history of cardiovascular disease with likely hyperlipidemia has had waxing and waning episodes of chest discomfort substernal in nature non-these occur with and without physical activity as well as inhaling smoke.  The patient had unbearable type symptoms early this morning and was unable to do her physical activity.  When seen in the emergency room she had continued issues with concerns of chest pain.  She does have an elevated troponin increasing now to 1.6 consistent with non-ST elevation myocardial infarction possibly due to cocaine abuse and arterial spasm versus coronary atherosclerosis.  The patient has had slight improvements with heparin and oxygen and other treatments.  Currently she is comfortable  Past Medical History:  Diagnosis Date  . COPD (chronic obstructive pulmonary disease) ([place])   . Depression   . Substance abuse [facility])       Surgical History:  Past Surgical History:  Procedure Laterality Date  . TUBAL LIGATION       Home Meds: Prior to Admission medications   Medication Sig Start Date End Date Taking? Authorizing Provider  esomeprazole (NEXIUM) 20 MG packet Take 20 mg by mouth daily before breakfast.   Yes [provider]  albuterol (PROVENTIL HFA;VENTOLIN HFA) 108 (90 BASE) MCG/ACT inhaler Inhale 2 puffs into the lungs every 2 (two) hours as needed for wheezing or shortness of breath (cough). Patient not taking: Reported on 12/02/2017 09/04/15   Street, [place], PA-C  albuterol (PROVENTIL HFA;VENTOLIN HFA) 108 [phone]  Base) MCG/ACT inhaler Inhale 2 puffs into the lungs every 6 (six) hours as needed for wheezing or shortness of breath. Patient not taking: Reported on 12/02/2017 10/05/17   Nance Pear, MD  azithromycin (ZITHROMAX Z-PAK) 250 MG tablet 2 po day one, then 1 daily x 4 days Patient not taking: Reported on 12/02/2017 09/04/15   Street, [place], PA-C  citalopram (CELEXA) 40 MG tablet Take 1 tablet (40 mg total) by mouth daily. 12/24/11 12/23/12  Judithann Sheen, MD  citalopram (CELEXA) 40 MG tablet Take 1 tablet (40 mg total) by mouth daily. Patient not taking: Reported on 12/02/2017 05/04/17   Lajean Saver, MD  cyclobenzaprine (FLEXERIL) 10 MG tablet Take 1 tablet (10 mg total) by mouth 3 (three) times daily as needed. Patient not taking: Reported on 12/02/2017 08/12/17   Sable Feil, PA-C  divalproex (DEPAKOTE) 250 MG DR tablet Take 1 pill in a.m. and 2 pills in p.m. Patient not taking: Reported on 12/02/2017 01/26/12   Judithann Sheen, MD  divalproex (DEPAKOTE) 250 MG DR tablet Take one tablet q AM, and two tables qhs. Patient not taking: Reported on 12/02/2017 05/04/17   Lajean Saver, MD  ferrous sulfate 325 (65 FE) MG tablet Take 1 tablet (325 mg total) by mouth 2 (two) times daily with a meal. Patient not taking: Reported on 12/02/2017 10/19/14   Charlann Lange, PA-C  guaiFENesin (MUCINEX) 600 MG 12 hr tablet Take 1 tablet (600 mg total) by mouth 2 (two) times daily as needed for cough or to loosen phlegm. Patient not taking: Reported on 12/02/2017 09/04/15   Street, [place], [place]  ibuprofen (ADVIL,MOTRIN) 600 MG tablet Take 1 tablet (600 mg total) by mouth every 8 (eight) hours as needed. Patient not taking: Reported on 12/02/2017 08/12/17   Sable Feil, PA-C  ibuprofen (ADVIL,MOTRIN) 800 MG tablet Take 1 tablet (800 mg total) by mouth 3 (three) times daily. Patient not taking: Reported on 09/04/2015 09/28/14   Fransico Meadow, PA-C  pantoprazole (PROTONIX) 40 MG tablet Take 1 tablet (40  mg total) by mouth daily. Patient not taking: Reported on 12/02/2017 05/04/17   Lajean Saver, MD  predniSONE (DELTASONE) 20 MG tablet 3 tabs po daily x 4 days Patient not taking: Reported on 12/02/2017 09/04/15   Street, [place], PA-C  traMADol (ULTRAM) 50 MG tablet Take 1 tablet (50 mg total) by mouth every 6 (six) hours as needed for moderate pain. Patient not taking: Reported on 12/02/2017 08/12/17   Sable Feil, PA-C  traZODone (DESYREL) 50 MG tablet Take 1 tablet (50 mg total) by mouth at bedtime as needed for sleep. 01/26/12 02/25/12  Judithann Sheen, MD    Inpatient Medications:  . [START ON 12/03/2017] aspirin EC  325 mg Oral Daily  . atorvastatin  40 mg Oral q1800  . nicotine  21 mg Transdermal Daily  . sodium chloride flush  3 mL Intravenous Q12H   . sodium chloride    . sodium chloride 75 mL/hr at 12/02/17 1804  . heparin 600 Units/hr (12/02/17 1423)    Allergies:  Allergies  Allergen Reactions  . Penicillins Swelling    Has patient had a PCN reaction causing immediate rash, facial/tongue/throat swelling, SOB or lightheadedness with hypotension: Yes Has patient had a PCN reaction causing severe rash involving mucus membranes or skin necrosis: Unknown Has patient had a PCN reaction that required hospitalization: No Has patient had a PCN reaction occurring within the last 10 years: No If all of the above answers are "NO", then may proceed with Cephalosporin use.    Social History   Socioeconomic History  . Marital status: Single    Spouse name: Not on file  . Number of children: Not on file  . Years of education: Not on file  . Highest education level: Not on file  Social Needs  . Financial resource strain: Not on file  . Food insecurity - worry: Not on file  . Food insecurity - inability: Not on file  . Transportation needs - medical: Not on file  . Transportation needs - non-medical: Not on file  Occupational History  . Not on file  Tobacco Use  . Smoking  status: Current Every Day Smoker    Packs/day: 0.50    Types: Cigarettes  . Smokeless tobacco: Never Used  . Tobacco comment: Started smoking at age 47.   Substance and Sexual Activity  . Alcohol use: No  . Drug use: Yes    Types: Marijuana, Cocaine    Comment: Her[place]- last used 1 month ago; currently at [facility] as of 04/14/2017  . Sexual activity: Not Currently  Other Topics Concern  . Not on file  Social History Narrative  . Not on file     No family history on file.   Review of Systems Positive for chest pain Negative for: General:  chills, fever, night sweats or weight changes.  Cardiovascular: PND orthopnea syncope dizziness  Dermatological skin lesions rashes Respiratory: Cough congestion Urologic: Frequent urination urination at night and hematuria Abdominal: negative for nausea, vomiting, diarrhea, bright red blood per rectum, melena, or hematemesis Neurologic: negative for visual  changes, and/or hearing changes  All other systems reviewed and are otherwise negative except as noted above.  Labs: Recent Labs    12/02/17 1059 12/02/17 1642  TROPONINI 0.07* 1.68*   Lab Results  Component Value Date   WBC 7.8 12/02/2017   HGB 10.7 (L) 12/02/2017   HCT 35.1 12/02/2017   MCV 65.0 (L) 12/02/2017   PLT 524 (H) 12/02/2017    Recent Labs  Lab 12/02/17 1059  NA 135  K 4.9  CL 107  CO2 21*  BUN 12  CREATININE 0.63  CALCIUM 8.3*  GLUCOSE 96   No results found for: CHOL, HDL, LDLCALC, TRIG Lab Results  Component Value Date   DDIMER 1.12 (H) 10/30/2011    Radiology/Studies:  Dg Chest 2 View  Result Date: 12/02/2017 CLINICAL DATA:  Increasing chest pain and tightness EXAM: CHEST  2 VIEW COMPARISON:  10/05/2017 FINDINGS: Mild hyperinflation, stable. Heart and mediastinal contours are within normal limits. No focal opacities or effusions. No acute bony abnormality. IMPRESSION: Stable mild hyperinflation.  No active cardiopulmonary disease. Electronically  Signed   By: Rolm Baptise M.D.   On: 12/02/2017 11:26    EKG: Normal sinus rhythm  Weights: Filed Weights   12/02/17 1058  Weight: 49.9 kg (110 lb)     Physical Exam: Blood pressure (!) 144/105, pulse 96, temperature 98.1 F (36.7 C), temperature source Oral, resp. rate 14, height 5' (1.524 m), weight 49.9 kg (110 lb), last menstrual period 11/28/2017, SpO2 99 %. Body mass index is 21.48 kg/m. General: Well developed, well nourished, in no acute distress. Head eyes ears nose throat: Normocephalic, atraumatic, sclera non-icteric, no xanthomas, nares are without discharge. No apparent thyromegaly and/or mass  Lungs: Normal respiratory effort.  no wheezes, no rales, no rhonchi.  Heart: RRR with normal S1 S2. no murmur gallop, no rub, PMI is normal size and placement, carotid upstroke normal without bruit, jugular venous pressure is normal Abdomen: Soft, non-tender, non-distended with normoactive bowel sounds. No hepatomegaly. No rebound/guarding. No obvious abdominal masses. Abdominal aorta is normal size without bruit Extremities: No edema. no cyanosis, no clubbing, no ulcers  Peripheral : 2+ bilateral upper extremity pulses, 2+ bilateral femoral pulses, 2+ bilateral dorsal pedal pulse Neuro: Alert and oriented. No facial asymmetry. No focal deficit. Moves all extremities spontaneously. Musculoskeletal: Normal muscle tone without kyphosis Psych:  Responds to questions appropriately with a normal affect.    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  Continue heparin for further risk reduction in myocardial infarction and chest pain 2.  Continue nitrates for chest discomfort 3.  Anxiolytics if necessary for concerns with withdrawal from some cocaine use 4.  Echocardiogram for  LV systolic dysfunction valvular heart disease 5.  Further consideration of cardiac catheterization for further evaluation of.  Patient understands risk and benefits of cardiac catheterization.  This includes a possibility dextrocardia tract infection bleeding or blood clot.  Patient is at low risk for conscious sedation  Signed, Corey Skains M.D. [facility] 12/02/2017, 6:05 PM

## 2017-12-02 NOTE — H&P (Signed)
[place] [place] NAME: [name]    MR#:  [id number]  DATE OF BIRTH:  January 14, 1971  DATE OF ADMISSION:  12/02/2017  PRIMARY CARE PHYSICIAN: Patient, No Pcp Per   REQUESTING/REFERRING PHYSICIAN: Dr. Mariea Clonts  CHIEF COMPLAINT:   Chief Complaint  Patient presents with  . Chest Pain   Chest pain 1 week worsening today. HISTORY OF PRESENT ILLNESS:  Denise Alexander  is a 46 y.o. female with a known history of COPD, depression and substance abuse.  The patient presents the ED with above chief complaints.  She has chest pain in substernal area, which is heavy without radiation.  She also complains of nausea and diaphoresis.  She used cocaine 2 days ago.  Her troponin level is elevated to 0.07.  She was given aspirin and started heparin drip by ED physician.  PAST MEDICAL HISTORY:   Past Medical History:  Diagnosis Date  . COPD (chronic obstructive pulmonary disease) ([place])   . Depression   . Substance abuse ([place])     PAST SURGICAL HISTORY:   Past Surgical History:  Procedure Laterality Date  . TUBAL LIGATION      SOCIAL HISTORY:   Social History   Tobacco Use  . Smoking status: Current Every Day Smoker    Packs/day: 0.50    Types: Cigarettes  . Smokeless tobacco: Never Used  . Tobacco comment: Started smoking at age 47.   Substance Use Topics  . Alcohol use: No    FAMILY HISTORY:  No family history on file.  DRUG ALLERGIES:   Allergies  Allergen Reactions  . Penicillins Swelling    Has patient had a PCN reaction causing immediate rash, facial/tongue/throat swelling, SOB or lightheadedness with hypotension: Yes Has patient had a PCN reaction causing severe rash involving mucus membranes or skin necrosis: Unknown Has patient had a PCN reaction that required hospitalization: No Has patient had a PCN reaction occurring within the last 10 years: No If all of the above answers are "NO", then may proceed with Cephalosporin  use.    REVIEW OF SYSTEMS:   Review of Systems  Constitutional: Positive for diaphoresis. Negative for chills, fever and malaise/fatigue.  HENT: Negative for sore throat.   Eyes: Negative for blurred vision and double vision.  Respiratory: Positive for cough. Negative for hemoptysis, shortness of breath, wheezing and stridor.   Cardiovascular: Positive for chest pain. Negative for palpitations, orthopnea and leg swelling.  Gastrointestinal: Negative for abdominal pain, blood in stool, diarrhea, melena, nausea and vomiting.  Genitourinary: Negative for dysuria, flank pain and hematuria.  Musculoskeletal: Negative for back pain and joint pain.  Skin: Negative for rash.  Neurological: Negative for dizziness, sensory change, focal weakness, seizures, loss of consciousness, weakness and headaches.  Endo/Heme/Allergies: Negative for polydipsia.  Psychiatric/Behavioral: Negative for depression. The patient is not nervous/anxious.     MEDICATIONS AT HOME:   Prior to Admission medications   Medication Sig Start Date End Date Taking? Authorizing Provider  esomeprazole (NEXIUM) 20 MG packet Take 20 mg by mouth daily before breakfast.   Yes [provider]  albuterol (PROVENTIL HFA;VENTOLIN HFA) 108 (90 BASE) MCG/ACT inhaler Inhale 2 puffs into the lungs every 2 (two) hours as needed for wheezing or shortness of breath (cough). Patient not taking: Reported on 12/02/2017 09/04/15   Street, [place], PA-C  albuterol (PROVENTIL HFA;VENTOLIN HFA) 108 [phone] Base) MCG/ACT inhaler Inhale 2 puffs into the lungs every 6 (six) hours as needed for wheezing  or shortness of breath. Patient not taking: Reported on 12/02/2017 10/05/17   Nance Pear, MD  azithromycin (ZITHROMAX Z-PAK) 250 MG tablet 2 po day one, then 1 daily x 4 days Patient not taking: Reported on 12/02/2017 09/04/15   Street, [place], PA-C  citalopram (CELEXA) 40 MG tablet Take 1 tablet (40 mg total) by mouth daily. 12/24/11 12/23/12   Judithann Sheen, MD  citalopram (CELEXA) 40 MG tablet Take 1 tablet (40 mg total) by mouth daily. Patient not taking: Reported on 12/02/2017 05/04/17   Lajean Saver, MD  cyclobenzaprine (FLEXERIL) 10 MG tablet Take 1 tablet (10 mg total) by mouth 3 (three) times daily as needed. Patient not taking: Reported on 12/02/2017 08/12/17   Sable Feil, PA-C  divalproex (DEPAKOTE) 250 MG DR tablet Take 1 pill in a.m. and 2 pills in p.m. Patient not taking: Reported on 12/02/2017 01/26/12   Judithann Sheen, MD  divalproex (DEPAKOTE) 250 MG DR tablet Take one tablet q AM, and two tables qhs. Patient not taking: Reported on 12/02/2017 05/04/17   Lajean Saver, MD  ferrous sulfate 325 (65 FE) MG tablet Take 1 tablet (325 mg total) by mouth 2 (two) times daily with a meal. Patient not taking: Reported on 12/02/2017 10/19/14   Charlann Lange, PA-C  guaiFENesin (MUCINEX) 600 MG 12 hr tablet Take 1 tablet (600 mg total) by mouth 2 (two) times daily as needed for cough or to loosen phlegm. Patient not taking: Reported on 12/02/2017 09/04/15   Street, [place], PA-C  ibuprofen (ADVIL,MOTRIN) 600 MG tablet Take 1 tablet (600 mg total) by mouth every 8 (eight) hours as needed. Patient not taking: Reported on 12/02/2017 08/12/17   Sable Feil, PA-C  ibuprofen (ADVIL,MOTRIN) 800 MG tablet Take 1 tablet (800 mg total) by mouth 3 (three) times daily. Patient not taking: Reported on 09/04/2015 09/28/14   Fransico Meadow, PA-C  pantoprazole (PROTONIX) 40 MG tablet Take 1 tablet (40 mg total) by mouth daily. Patient not taking: Reported on 12/02/2017 05/04/17   Lajean Saver, MD  predniSONE (DELTASONE) 20 MG tablet 3 tabs po daily x 4 days Patient not taking: Reported on 12/02/2017 09/04/15   Street, [place], PA-C  traMADol (ULTRAM) 50 MG tablet Take 1 tablet (50 mg total) by mouth every 6 (six) hours as needed for moderate pain. Patient not taking: Reported on 12/02/2017 08/12/17   Sable Feil, PA-C  traZODone  (DESYREL) 50 MG tablet Take 1 tablet (50 mg total) by mouth at bedtime as needed for sleep. 01/26/12 02/25/12  Judithann Sheen, MD      VITAL SIGNS:  Blood pressure (!) 151/111, pulse 92, temperature 98.6 F (37 C), temperature source Oral, resp. rate (!) 24, height 5' (1.524 m), weight 110 lb (49.9 kg), last menstrual period 11/28/2017, SpO2 100 %.  PHYSICAL EXAMINATION:  Physical Exam  GENERAL:  46 y.o.-year-old patient lying in the bed with no acute distress.  EYES: Pupils equal, round, reactive to light and accommodation. No scleral icterus. Extraocular muscles intact.  HEENT: Head atraumatic, normocephalic. Oropharynx and nasopharynx clear.  NECK:  Supple, no jugular venous distention. No thyroid enlargement, no tenderness.  LUNGS: Normal breath sounds bilaterally, no wheezing, rales,rhonchi or crepitation. No use of accessory muscles of respiration.  CARDIOVASCULAR: S1, S2 normal. No murmurs, rubs, or gallops.  ABDOMEN: Soft, nontender, nondistended. Bowel sounds present. No organomegaly or mass.  EXTREMITIES: No pedal edema, cyanosis, or clubbing.  NEUROLOGIC: Cranial nerves II through XII are intact. Muscle strength  5/5 in all extremities. Sensation intact. Gait not checked.  PSYCHIATRIC: The patient is alert and oriented x 3.  SKIN: No obvious rash, lesion, or ulcer.   LABORATORY PANEL:   CBC Recent Labs  Lab 12/02/17 1059  WBC 7.8  HGB 10.7*  HCT 35.1  PLT 524*   ------------------------------------------------------------------------------------------------------------------  Chemistries  Recent Labs  Lab 12/02/17 1059  NA 135  K 4.9  CL 107  CO2 21*  GLUCOSE 96  BUN 12  CREATININE 0.63  CALCIUM 8.3*   ------------------------------------------------------------------------------------------------------------------  Cardiac Enzymes Recent Labs  Lab 12/02/17 1059  TROPONINI 0.07*    ------------------------------------------------------------------------------------------------------------------  RADIOLOGY:  Dg Chest 2 View  Result Date: 12/02/2017 CLINICAL DATA:  Increasing chest pain and tightness EXAM: CHEST  2 VIEW COMPARISON:  10/05/2017 FINDINGS: Mild hyperinflation, stable. Heart and mediastinal contours are within normal limits. No focal opacities or effusions. No acute bony abnormality. IMPRESSION: Stable mild hyperinflation.  No active cardiopulmonary disease. Electronically Signed   By: Rolm Baptise M.D.   On: 12/02/2017 11:26      IMPRESSION AND PLAN:   ACS with cocaine abuse. The patient will be admitted to medical floor. Continue heparin drip, aspirin and Lipitor, nitroglycerin as needed,  follow-up troponin level and cardiology consult. N.p.o. after midnight for possible cardiac cath.  Cocaine abuse.  Consult for drug abuse cessation.  Tobacco abuse.  Smoking cessation was counseled for 4 minutes. COPD.  Stable.  Nebulizer as needed.  All the records are reviewed and case discussed with ED provider. Management plans discussed with the patient, her son and they are in agreement.  CODE STATUS: Full code  TOTAL TIME TAKING CARE OF THIS PATIENT: 55 minutes.    Demetrios Loll M.D on 12/02/2017 at 2:31 PM  Between 7am to 6pm - Pager - [phone]  After 6pm go to [email or web address] - Proofreader  [facility]  Office  [phone]  CC: Primary care physician; Patient, No Pcp Per   Note: This dictation was prepared with Dragon dictation along with smaller phrase technology. Any transcriptional errors that result from this process are unin

## 2017-12-02 NOTE — ED Provider Notes (Signed)
[facility] Emergency Department Provider Note  ____________________________________________  Time seen: Approximately 1:32 PM  I have reviewed the triage vital signs and the nursing notes.   HISTORY  Chief Complaint Chest Pain    HPI Denise Alexander is a 46 y.o. female w/ ongoing cocaine abuse presenting with 3 days of chest pain.  The patient reports that she last used cocaine 2 days ago.  She has been having a constant central chest "tightness and pressure" associated with shortness of breath, even at rest, and multiple episodes of diaphoresis.  No nausea or vomiting palpitations, lightheadedness or syncope.  No lower extremity swelling or calf pain.  No recent illness including cough or cold symptoms, fever or chills.  The patient has no history of hypertension, hyperlipidemia or diabetes, but does not regularly see a physician.  Past Medical History:  Diagnosis Date  . COPD (chronic obstructive pulmonary disease) ([place])   . Depression   . Substance abuse [facility])     Patient Active Problem List   Diagnosis Date Noted  . Lower extremity edema 11/26/2011  . ANEMIA, IRON DEFICIENCY 07/24/2010  . ANEMIA 03/05/2010  . BIPOLAR DISORDER UNSPECIFIED 01/16/2009  . DEPRESSION/ANXIETY 12/17/2008  . TOBACCO ABUSE 12/17/2008  . GASTROESOPHAGEAL REFLUX DISEASE 12/17/2008    Past Surgical History:  Procedure Laterality Date  . TUBAL LIGATION      Current Outpatient Rx  . Order #: [id number] Class: Print  . Order #: [id number] Class: Print  . Order #: [id number] Class: Historical Med  . Order #: [id number] Class: Print  . Order #: [id number] Class: Historical Med  . Order #: [id number] Class: Normal  . Order #: [id number] Class: Print  . Order #: [id number] Class: Print  . Order #: [id number] Class: Print  . Order #: [id number] Class: Print  . Order #: [id number] Class: Print  . Order #: [id number] Class: Print  . Order #: [id number] Class: Print  . Order #: [id number] Class: Print  .  Order #: [id number] Class: Historical Med  . Order #: [id number] Class: Print  . Order #: [id number] Class: Print  . Order #: [id number] Class: Historical Med  . Order #: [id number] Class: Historical Med  . Order #: [id number] Class: Historical Med  . Order #: [id number] Class: Print  . Order #: [id number] Class: Print    Allergies Penicillins  No family history on file.  Social History Social History   Tobacco Use  . Smoking status: Current Every Day Smoker    Packs/day: 0.50    Types: Cigarettes  . Smokeless tobacco: Never Used  . Tobacco comment: Started smoking at age 86.   Substance Use Topics  . Alcohol use: No  . Drug use: Yes    Types: Marijuana, Cocaine    Comment: [place]- last used 1 month ago; currently at [facility] as of 04/14/2017    Review of Systems Constitutional: No fever/chills.  No lightheadedness or syncope.  Positive diaphoresis.. Eyes: No visual changes. ENT: No sore throat. No congestion or rhinorrhea. Cardiovascular: Positive chest pain. Denies palpitations. Respiratory: Positive shortness of breath.  No cough. Gastrointestinal: No abdominal pain.  No nausea, no vomiting.  No diarrhea.  No constipation. Genitourinary: Negative for dysuria. Musculoskeletal: Negative for back pain. Skin: Negative for rash. Neurological: Negative for headaches. No focal numbness, tingling or weakness.  Psychiatric:Positive cocaine abuse.  ____________________________________________   PHYSICAL EXAM:  VITAL SIGNS: ED Triage Vitals [12/02/17 1058]  Enc Vitals Group     BP (!) 151/111     Pulse Rate 92     Resp (!) 24     Temp 98.6 F (  37 C)     Temp Source Oral     SpO2 100 %     Weight 110 lb (49.9 kg)     Height 5' (1.524 m)     Head Circumference      Peak Flow      Pain Score 10     Pain Loc      Pain Edu?      Excl. in [place]?     Constitutional: Alert and oriented.  Chronically ill appearing and in no acute distress. Answers questions appropriately. Eyes:  Conjunctivae are normal.  EOMI. No scleral icterus. Head: Atraumatic. Nose: No congestion/rhinnorhea. Mouth/Throat: Mucous membranes are moist.  Neck: No stridor.  Supple.  No JVD.  No meningismus. Cardiovascular: Normal rate, regular rhythm. No murmurs, rubs or gallops.  Respiratory: Normal respiratory effort.  No accessory muscle use or retractions. Lungs CTAB.  No wheezes, rales or ronchi. Gastrointestinal: Soft, nontender and nondistended.  No guarding or rebound.  No peritoneal signs. Musculoskeletal: No LE edema. No ttp in the calves or palpable cords.  Negative Homan's sign. Neurologic:  A&Ox3.  Speech is clear.  Face and smile are symmetric.  EOMI.  Moves all extremities well. Skin:  Skin is warm, dry and intact. No rash noted. Psychiatric: Mood and affect are normal. Speech and behavior are normal.  Normal judgement.  ____________________________________________   LABS (all labs ordered are listed, but only abnormal results are displayed)  Labs Reviewed  BASIC METABOLIC PANEL - Abnormal; Notable for the following components:      Result Value   CO2 21 (*)    Calcium 8.3 (*)    All other components within normal limits  CBC - Abnormal; Notable for the following components:   RBC 5.40 (*)    Hemoglobin 10.7 (*)    MCV 65.0 (*)    MCH 19.8 (*)    MCHC 30.5 (*)    RDW 21.8 (*)    Platelets 524 (*)    All other components within normal limits  TROPONIN I - Abnormal; Notable for the following components:   Troponin I 0.07 (*)    All other components within normal limits  PROTIME-INR  APTT  POCT PREGNANCY, URINE   ____________________________________________  EKG  ED ECG REPORT I, Eula Listen, the attending physician, personally viewed and interpreted this ECG.   Date: 12/02/2017  EKG Time: 1056  Rate: 95  Rhythm: normal sinus rhythm  Axis: normal  Intervals:none  ST&T Change: Nonspecific T wave inversion in V1.  No  STEMI.  ____________________________________________  RADIOLOGY  Dg Chest 2 View  Result Date: 12/02/2017 CLINICAL DATA:  Increasing chest pain and tightness EXAM: CHEST  2 VIEW COMPARISON:  10/05/2017 FINDINGS: Mild hyperinflation, stable. Heart and mediastinal contours are within normal limits. No focal opacities or effusions. No acute bony abnormality. IMPRESSION: Stable mild hyperinflation.  No active cardiopulmonary disease. Electronically Signed   By: Rolm Baptise M.D.   On: 12/02/2017 11:26    ____________________________________________   PROCEDURES  Procedure(s) performed: None  Procedures  Critical Care performed: Yes, see critical care note(s) ____________________________________________   INITIAL IMPRESSION / ASSESSMENT AND PLAN / ED COURSE  Pertinent labs & imaging results that were available during my care of the patient were reviewed by me and considered in my medical decision making (see chart for details).  46 y.o. female with ongoing tobacco abuse who has been having constant chest pain for 3 days and has a positive troponin.  Overall, the patient  is hypertensive here, but otherwise hemodynamically stable.  She continues to [facility] normally.  I am concerned about ACS or MI, unstable angina in this patient although cocaine induced chest pain is also possible.  A pulmonary or aortic causes very unlikely.  Will initiate aspirin treatment, heparinization, and admission to the hospital.  The patient will be given nitroglycerin for symptom control and reevaluated.  ____________________________________________  FINAL CLINICAL IMPRESSION(S) / ED DIAGNOSES  Final diagnoses:  NSTEMI (non-ST elevated myocardial infarction) ([place])  Cocaine abuse ([place])         NEW MEDICATIONS STARTED DURING THIS VISIT:  This SmartLink is deprecated. Use AVSMEDLIST instead to display the medication list for a patient.    Eula Listen, MD 12/02/17 1339

## 2017-12-02 NOTE — ED Triage Notes (Signed)
Pt c/o increased chest pain/tightness this morning, states she has been having chest pain for "a while". But never this bad, pt is tearful in triage.Denise Alexander

## 2017-12-02 NOTE — Progress Notes (Signed)
Patient admitted from the ED for chest pain and possible NSTEMI. Admits to abusing IV crack cocaine.  Last used 2 days ago.  Severe chest pain unrelieved with SL nitro.  1 Norco given.  Explained the cardiac cath process and what to expect.  Patient and her sisters are anxious and worried about this admission.

## 2017-12-02 NOTE — ED Notes (Signed)
Hospitalist to bedside at this time 

## 2017-12-03 ENCOUNTER — Encounter: Payer: Self-pay | Admitting: *Deleted

## 2017-12-03 ENCOUNTER — Encounter
Admission: EM | Disposition: A | Discharge status: Home / Self Care | Payer: Self-pay | Source: Home / Self Care | Attending: Internal Medicine

## 2017-12-03 ENCOUNTER — Inpatient Hospital Stay
Admit: 2017-12-03 | Discharge: 2017-12-03 | Disposition: A | Discharge status: Home / Self Care | Payer: Self-pay | Attending: Internal Medicine | Admitting: Internal Medicine

## 2017-12-03 HISTORY — PX: LEFT HEART CATH AND CORONARY ANGIOGRAPHY: CATH118249

## 2017-12-03 LAB — BASIC METABOLIC PANEL
Anion gap: 4 — ABNORMAL LOW (ref 5–15)
Anion gap: 4 — ABNORMAL LOW (ref 5–15)
BUN: 6 mg/dL (ref 6–20)
BUN: <5 mg/dL — ABNORMAL LOW (ref 6–20)
CHLORIDE: 111 mmol/L (ref 101–111)
CHLORIDE: 111 mmol/L (ref 101–111)
CO2: 21 mmol/L — AB (ref 22–32)
CO2: 22 mmol/L (ref 22–32)
CREATININE: 0.57 mg/dL (ref 0.44–1.00)
Calcium: 7.8 mg/dL — ABNORMAL LOW (ref 8.9–10.3)
Calcium: 7.9 mg/dL — ABNORMAL LOW (ref 8.9–10.3)
Creatinine, Ser: 0.53 mg/dL (ref 0.44–1.00)
GFR calc Af Amer: >60 mL/min (ref >60)
GFR calc non Af Amer: >60 mL/min (ref >60)
GLUCOSE: 91 mg/dL (ref 65–99)
Glucose, Bld: 83 mg/dL (ref 65–99)
POTASSIUM: 4 mmol/L (ref 3.5–5.1)
Potassium: 4 mmol/L (ref 3.5–5.1)
SODIUM: 137 mmol/L (ref 135–145)
Sodium: 136 mmol/L (ref 135–145)

## 2017-12-03 LAB — CBC
HCT: 28.3 % — ABNORMAL LOW (ref 35.0–47.0)
HCT: 28.4 % — ABNORMAL LOW (ref 35.0–47.0)
HEMOGLOBIN: 8.5 g/dL — AB (ref 12.0–16.0)
Hemoglobin: 8.6 g/dL — ABNORMAL LOW (ref 12.0–16.0)
MCH: 19.5 pg — AB (ref 26.0–34.0)
MCH: 19.2 pg — ABNORMAL LOW (ref 26.0–34.0)
MCHC: 30.4 g/dL — AB (ref 32.0–36.0)
MCHC: 29.9 g/dL — AB (ref 32.0–36.0)
MCV: 64.1 fL — AB (ref 80.0–100.0)
MCV: 64.3 fL — ABNORMAL LOW (ref 80.0–100.0)
PLATELETS: 409 10*3/uL (ref 150–440)
Platelets: 403 10*3/uL (ref 150–440)
RBC: 4.41 MIL/uL (ref 3.80–5.20)
RBC: 4.42 MIL/uL (ref 3.80–5.20)
RDW: 21.5 % — AB (ref 11.5–14.5)
RDW: 21.2 % — AB (ref 11.5–14.5)
WBC: 4.6 10*3/uL (ref 3.6–11.0)
WBC: 3.7 10*3/uL (ref 3.6–11.0)

## 2017-12-03 LAB — URINE DRUG SCREEN, QUALITATIVE (ARMC ONLY)
Amphetamines, Ur Screen: NOT DETECTED
BARBITURATES, UR SCREEN: NOT DETECTED
Benzodiazepine, Ur Scrn: NOT DETECTED
CANNABINOID 50 NG, UR ~~LOC~~: POSITIVE — AB
COCAINE METABOLITE, UR ~~LOC~~: POSITIVE — AB
MDMA (Ecstasy)Ur Screen: NOT DETECTED
METHADONE SCREEN, URINE: NOT DETECTED
Opiate, Ur Screen: POSITIVE — AB
Phencyclidine (PCP) Ur S: NOT DETECTED
TRICYCLIC, UR SCREEN: NOT DETECTED

## 2017-12-03 LAB — HIV ANTIBODY (ROUTINE TESTING W REFLEX): HIV SCREEN 4TH GENERATION: NONREACTIVE

## 2017-12-03 LAB — LIPID PANEL
Cholesterol: 122 mg/dL (ref 0–200)
HDL: 22 mg/dL — ABNORMAL LOW (ref >40)
LDL CALC: 82 mg/dL (ref 0–99)
Total CHOL/HDL Ratio: 5.5 RATIO
Triglycerides: 91 mg/dL (ref <150)
VLDL: 18 mg/dL (ref 0–40)

## 2017-12-03 LAB — ECHOCARDIOGRAM COMPLETE
Height: 60 in
WEIGHTICAEL: 1760 [oz_av]

## 2017-12-03 LAB — PROTIME-INR
INR: 1.1
PROTHROMBIN TIME: 14.1 s (ref 11.4–15.2)

## 2017-12-03 SURGERY — LEFT HEART CATH AND CORONARY ANGIOGRAPHY
Anesthesia: Moderate Sedation | Laterality: Right

## 2017-12-03 MED ORDER — SODIUM CHLORIDE 0.9% FLUSH
3.0000 mL | Freq: Two times a day (BID) | INTRAVENOUS | Status: DC
  Administered 2017-12-03: 3 mL via INTRAVENOUS

## 2017-12-03 MED ORDER — SODIUM CHLORIDE 0.9% FLUSH: 3.0000 mL | Freq: Two times a day (BID) | INTRAVENOUS | Status: DC

## 2017-12-03 MED ORDER — NITROGLYCERIN 0.4 MG SL SUBL
0.4000 mg | SUBLINGUAL_TABLET | SUBLINGUAL | 2 refills | Status: DC | PRN
Start: 2017-12-03 — End: 2020-10-17

## 2017-12-03 MED ORDER — SODIUM CHLORIDE 0.9 % WEIGHT BASED INFUSION
1.0000 mL/kg/h | INTRAVENOUS | Status: DC
  Administered 2017-12-03: 1 mL/kg/h via INTRAVENOUS

## 2017-12-03 MED ORDER — MIDAZOLAM HCL 2 MG/2ML IJ SOLN
INTRAMUSCULAR | Status: DC | PRN
  Administered 2017-12-03: 1 mg via INTRAVENOUS

## 2017-12-03 MED ORDER — FENTANYL CITRATE (PF) 100 MCG/2ML IJ SOLN
INTRAMUSCULAR | Status: AC
  Filled 2017-12-03: qty 2

## 2017-12-03 MED ORDER — ISOSORBIDE MONONITRATE ER 30 MG PO TB24
30.0000 mg | ORAL_TABLET | Freq: Every day | ORAL | Status: DC
  Administered 2017-12-03: 30 mg via ORAL
  Filled 2017-12-03: qty 1

## 2017-12-03 MED ORDER — SODIUM CHLORIDE 0.9 % IV SOLN: 250.0000 mL | INTRAVENOUS | Status: DC | PRN

## 2017-12-03 MED ORDER — MIDAZOLAM HCL 2 MG/2ML IJ SOLN
INTRAMUSCULAR | Status: AC
  Filled 2017-12-03: qty 2

## 2017-12-03 MED ORDER — ASPIRIN 81 MG PO CHEW: 81.0000 mg | CHEWABLE_TABLET | ORAL | Status: DC

## 2017-12-03 MED ORDER — SODIUM CHLORIDE 0.9 % WEIGHT BASED INFUSION
3.0000 mL/kg/h | INTRAVENOUS | Status: DC
  Administered 2017-12-03: 3 mL/kg/h via INTRAVENOUS

## 2017-12-03 MED ORDER — ONDANSETRON HCL 4 MG/2ML IJ SOLN: 4.0000 mg | Freq: Four times a day (QID) | INTRAMUSCULAR | Status: DC | PRN

## 2017-12-03 MED ORDER — SODIUM CHLORIDE 0.9% FLUSH: 3.0000 mL | INTRAVENOUS | Status: DC | PRN

## 2017-12-03 MED ORDER — ISOSORBIDE MONONITRATE ER 30 MG PO TB24: 30.0000 mg | ORAL_TABLET | Freq: Every day | ORAL | 1 refills | Status: DC

## 2017-12-03 MED ORDER — ASPIRIN 81 MG PO TBEC: 81.0000 mg | DELAYED_RELEASE_TABLET | Freq: Every day | ORAL | 0 refills | Status: DC

## 2017-12-03 MED ORDER — LIDOCAINE HCL (PF) 1 % IJ SOLN
INTRAMUSCULAR | Status: AC
  Filled 2017-12-03: qty 30

## 2017-12-03 MED ORDER — IOPAMIDOL (ISOVUE-300) INJECTION 61%
INTRAVENOUS | Status: DC | PRN
  Administered 2017-12-03: 70 mL via INTRA_ARTERIAL

## 2017-12-03 MED ORDER — ACETAMINOPHEN 325 MG PO TABS: 650.0000 mg | ORAL_TABLET | ORAL | Status: DC | PRN

## 2017-12-03 MED ORDER — FENTANYL CITRATE (PF) 100 MCG/2ML IJ SOLN
INTRAMUSCULAR | Status: DC | PRN
  Administered 2017-12-03: 25 ug via INTRAVENOUS

## 2017-12-03 MED ORDER — ATORVASTATIN CALCIUM 40 MG PO TABS: 40.0000 mg | ORAL_TABLET | Freq: Every day | ORAL | 0 refills | Status: DC

## 2017-12-03 MED ORDER — ISOSORBIDE MONONITRATE ER 30 MG PO TB24: 30.0000 mg | ORAL_TABLET | Freq: Every day | ORAL | 0 refills | Status: DC

## 2017-12-03 MED ORDER — ASPIRIN EC 81 MG PO TBEC: 81.0000 mg | DELAYED_RELEASE_TABLET | Freq: Every day | ORAL | Status: DC

## 2017-12-03 SURGICAL SUPPLY — 8 items
CATH 5FR JR4 DIAGNOSTIC (CATHETERS) ×2 IMPLANT
CATH INFINITI 5FR ANG PIGTAIL (CATHETERS) ×2 IMPLANT
CATH INFINITI 5FR JL4 (CATHETERS) ×2 IMPLANT
GUIDEWIRE 3MM J TIP .035 145 (WIRE) ×2 IMPLANT
KIT MANI 3VAL PERCEP (MISCELLANEOUS) ×2 IMPLANT
NEEDLE PERC 18GX7CM (NEEDLE) ×2 IMPLANT
PACK CARDIAC CATH (CUSTOM PROCEDURE TRAY) ×2 IMPLANT
SHEATH AVANTI 5FR X 11CM (SHEATH) ×2 IMPLANT

## 2017-12-03 NOTE — Plan of Care (Signed)
  Activity: Risk for activity intolerance will decrease 12/03/2017 0033 - Progressing by Marylouise Stacks, RN   Skin Integrity: Risk for impaired skin integrity will decrease 12/03/2017 0033 - Progressing by Marylouise Stacks, RN   Skin Integrity: Risk for impaired skin integrity will decrease 12/03/2017 0033 - Progressing by Marylouise Stacks, RN   Education: Knowledge of General Education information will improve 12/03/2017 0033 - Progressing by Marylouise Stacks, RN   Health Behavior/Discharge Planning: Ability to manage health-related needs will improve 12/03/2017 0033 - Progressing by Marylouise Stacks, RN   Clinical Measurements: Diagnostic test results will improve 12/03/2017 0033 - Progressing by Marylouise Stacks, RN

## 2017-12-03 NOTE — Progress Notes (Signed)
[facility] NAME: Denise Alexander    MR#:  [id number]  DATE OF BIRTH:  10/27/1971  SUBJECTIVE:    REVIEW OF SYSTEMS:   ROS Tolerating Diet: Tolerating PT:   DRUG ALLERGIES:   Allergies  Allergen Reactions  . Penicillins Swelling    Has patient had a PCN reaction causing immediate rash, facial/tongue/throat swelling, SOB or lightheadedness with hypotension: Yes Has patient had a PCN reaction causing severe rash involving mucus membranes or skin necrosis: Unknown Has patient had a PCN reaction that required hospitalization: No Has patient had a PCN reaction occurring within the last 10 years: No If all of the above answers are "NO", then may proceed with Cephalosporin use.    VITALS:  Blood pressure 136/83, pulse 73, temperature 98 F (36.7 C), temperature source Oral, resp. rate 18, height 5' (1.524 m), weight 49.9 kg (110 lb), last menstrual period 11/28/2017, SpO2 98 %.  PHYSICAL EXAMINATION:   Physical Exam  GENERAL:  46 y.o.-year-old patient lying in the bed with no acute distress.  EYES: Pupils equal, round, reactive to light and accommodation. No scleral icterus. Extraocular muscles intact.  HEENT: Head atraumatic, normocephalic. Oropharynx and nasopharynx clear.  NECK:  Supple, no jugular venous distention. No thyroid enlargement, no tenderness.  LUNGS: Normal breath sounds bilaterally, no wheezing, rales, rhonchi. No use of accessory muscles of respiration.  CARDIOVASCULAR: S1, S2 normal. No murmurs, rubs, or gallops.  ABDOMEN: Soft, nontender, nondistended. Bowel sounds present. No organomegaly or mass.  EXTREMITIES: No cyanosis, clubbing or edema b/l.    NEUROLOGIC: Cranial nerves II through XII are intact. No focal Motor or sensory deficits b/l.   PSYCHIATRIC:  patient is alert and oriented x 3.  SKIN: No obvious rash, lesion, or ulcer.   LABORATORY PANEL:  CBC Recent Labs  Lab 12/03/17 0414  WBC 4.6   HGB 8.6*  HCT 28.3*  PLT 403    Chemistries  Recent Labs  Lab 12/03/17 0414  NA 136  K 4.0  CL 111  CO2 21*  GLUCOSE 91  BUN 6  CREATININE 0.53  CALCIUM 7.8*   Cardiac Enzymes Recent Labs  Lab 12/02/17 2213  TROPONINI 3.41*   RADIOLOGY:  Dg Chest 2 View  Result Date: 12/02/2017 CLINICAL DATA:  Increasing chest pain and tightness EXAM: CHEST  2 VIEW COMPARISON:  10/05/2017 FINDINGS: Mild hyperinflation, stable. Heart and mediastinal contours are within normal limits. No focal opacities or effusions. No acute bony abnormality. IMPRESSION: Stable mild hyperinflation.  No active cardiopulmonary disease. Electronically Signed   By: Rolm Baptise M.D.   On: 12/02/2017 11:26   ASSESSMENT AND PLAN:  Denise Alexander is a 46 y.o. female w/ ongoing cocaine abuse presenting with 3 days of chest pain.  The patient reports that she last used cocaine 2 days ago.  She has been having a constant central chest "tightness and pressure" associated with shortness of breath, even at rest, and multiple episodes of diaphoresis.  No nausea or vomiting palpitations, lightheadedness or syncope.  1.  Acute NST EMI in the setting of cocaine abuse/coronary vasospasm  -Continue aspirin as needed nitro and heparin drip -Blood pressure stable.  No beta-blockers given urine drug screen positive for cocaine -Seen by Dr. Nehemiah Massed.  Awaiting echo and then decision for cardiac cath -Troponin 0.07--- 1.68--- 3.41  2.  Iron deficiency anemia -We will start patient on iron supplements -Workup as outpatient  3.  Polysubstance abuse Patient advised  abstinence Urine drug screen positive for cocaine and cannabinoid  4.  Tobacco abuse Nicotine patch applied  Discussed with sister Case discussed with Care Management/Social Worker. Management plans discussed with the patient, family and they are in agreement.  CODE STATUS: Full  DVT Prophylaxis: IV heparin drip  TOTAL TIME TAKING CARE OF THIS PATIENT: *30*  minutes.  >50% time spent on counselling and coordination of care  POSSIBLE D/C IN 1-2 DAYS, DEPENDING ON CLINICAL CONDITION.  Note: This dictation was prepared with Dragon dictation along with smaller phrase technology. Any transcriptional errors that result from this process are unintentional.  Fritzi Mandes M.D on 12/03/2017 at 7:13 AM  Between 7am to 6pm - Pager - [phone]  After 6pm go to [email or web address] - password [email or web address] [facility]  Office  [phone]  CC: Primary care physician; Patient, No Pcp Per

## 2017-12-03 NOTE — Progress Notes (Signed)
*  PRELIMINARY RESULTS* Echocardiogram 2D Echocardiogram has been performed.  Sherrie Sport 12/03/2017, 9:31 AM

## 2017-12-03 NOTE — Progress Notes (Signed)
CRITICAL VALUE ALERT  Critical Value:  Troponin 1.68  Date & Time Notied: 12/13 1700 Provider Notified: 1715. Dr Nehemiah Massed verbally notified  Orders Received/Actions taken: none

## 2017-12-03 NOTE — Progress Notes (Signed)
Sheath pulled at bedside post cath by C. Borchers, Therapist, sports. Manual pressure held x 10 min. Total (13:45-13:55). Pt. Tolerated well.

## 2017-12-03 NOTE — Plan of Care (Signed)
  Pain Managment: General experience of comfort will improve 12/03/2017 0033 - Not Progressing by Marylouise Stacks, RN 12/03/2017 0033 - Progressing by Marylouise Stacks, RN  Continues to report chest pain. Receiving PRN pain meds regularly. Will continue to monitor.

## 2017-12-03 NOTE — Progress Notes (Signed)
SUBJECTIVE: Patient continues to have chest pain with elevated troponin and trending up. I was asked by Dr. Mabeline Caras to cardiac catheter   Vitals:   12/02/17 1715 12/02/17 2000 12/03/17 0339 12/03/17 0755  BP: (!) 144/105 (!) 141/92 136/83 106/66  Pulse: 96 90 73 69  Resp: 14 16 18 18   Temp:  97.8 F (36.6 C) 98 F (36.7 C) 98.2 F (36.8 C)  TempSrc:  Oral Oral Oral  SpO2: 99% 100% 98% 99%  Weight:      Height:        Intake/Output Summary (Last 24 hours) at 12/03/2017 0830 Last data filed at 12/03/2017 0459 Gross per 24 hour  Intake 240 ml  Output 1150 ml  Net -910 ml    LABS: Basic Metabolic Panel: Recent Labs    12/02/17 1059 12/03/17 0414  NA 135 136  K 4.9 4.0  CL 107 111  CO2 21* 21*  GLUCOSE 96 91  BUN 12 6  CREATININE 0.63 0.53  CALCIUM 8.3* 7.8*   Liver Function Tests: No results for input(s): AST, ALT, ALKPHOS, BILITOT, PROT, ALBUMIN in the last 72 hours. No results for input(s): LIPASE, AMYLASE in the last 72 hours. CBC: Recent Labs    12/02/17 1059 12/03/17 0414  WBC 7.8 4.6  HGB 10.7* 8.6*  HCT 35.1 28.3*  MCV 65.0* 64.1*  PLT 524* 403   Cardiac Enzymes: Recent Labs    12/02/17 1059 12/02/17 1642 12/02/17 2213  TROPONINI 0.07* 1.68* 3.41*   BNP: Invalid input(s): POCBNP D-Dimer: No results for input(s): DDIMER in the last 72 hours. Hemoglobin A1C: No results for input(s): HGBA1C in the last 72 hours. Fasting Lipid Panel: Recent Labs    12/03/17 0414  CHOL 122  HDL 22*  LDLCALC 82  TRIG 91  CHOLHDL 5.5   Thyroid Function Tests: No results for input(s): TSH, T4TOTAL, T3FREE, THYROIDAB in the last 72 hours.  Invalid input(s): FREET3 Anemia Panel: No results for input(s): VITAMINB12, FOLATE, FERRITIN, TIBC, IRON, RETICCTPCT in the last 72 hours.   PHYSICAL EXAM General: Well developed, well nourished, in no acute distress HEENT:  Normocephalic and atramatic Neck:  No JVD.  Lungs: Clear bilaterally to auscultation and  percussion. Heart: HRRR . Normal S1 and S2 without gallops or murmurs.  Abdomen: Bowel sounds are positive, abdomen soft and non-tender  Msk:  Back normal, normal gait. Normal strength and tone for age. Extremities: No clubbing, cyanosis or edema.   Neuro: Alert and oriented X 3. Psych:  Good affect, responds appropriately  TELEMETRY: Sinus tachycardia  ASSESSMENT AND PLAN: Non-STEMI with elevated troponin and ongoing chest pain with history of cocaine use. Patient was explained risks and benefits of cardiac catheterization and patient has agreed to cardiac catheterization which will be done today.  Active Problems:   ACS (acute coronary syndrome) (HCC)    Revan Gendron A, MD, [facility] 12/03/2017 8:30 AM

## 2017-12-03 NOTE — Progress Notes (Signed)
Patient has 40-50 %  First ,mid Diagnal lesion and the vessel is less than 2 mm. LAD/LCX/RCA normal with normal LVEF and wall motion. May go home on isosorbide 330 mg daily as probably had spasm in diagnal due to coccain use. May go home with f/u Tuesday 11 AM.

## 2017-12-03 NOTE — Progress Notes (Signed)
Discharging home with her sister.  Cath site is clean, surrounding skin is soft.  She has discomfort on the site.  Using ice for comfort.  Instructed on site care.  Instructed on stopping substance abuse

## 2017-12-03 NOTE — Discharge Summary (Signed)
[facility] [place] NAME: Denise Alexander    MR#:  [id number]  DATE OF BIRTH:  02-04-1971  DATE OF ADMISSION:  12/02/2017 ADMITTING PHYSICIAN: Demetrios Loll, MD  DATE OF DISCHARGE: 12/03/17  PRIMARY CARE PHYSICIAN: Patient, No Pcp Per    ADMISSION DIAGNOSIS:  Cocaine abuse ([place]) [F14.10] NSTEMI (non-ST elevated myocardial infarction) ([place]) [I21.4]  DISCHARGE DIAGNOSIS:  Acute NST EMI secondary to coronary vasospasm from cocaine abuse Substance drug abuse  SECONDARY DIAGNOSIS:   Past Medical History:  Diagnosis Date  . COPD (chronic obstructive pulmonary disease) ([place])   . Depression   . Substance abuse [facility])     HOSPITAL COURSE:   Denise Alexander a 46 y.o.femalew/ ongoingcocaine abuse presenting with 3 days of chest pain. The patient reports that she last used cocaine 2 days ago. She has been having a constant central chest "tightness and pressure" associated with shortness of breath, even at rest, and multiple episodes of diaphoresis. No nausea or vomiting palpitations, lightheadedness or syncope.  1.  Acute NST EMI in the setting of cocaine abuse/coronary vasospasm  -Continue aspirin as needed nitro and heparin drip -Blood pressure stable.  No beta-blockers given urine drug screen positive for cocaine --Troponin 0.07--- 1.68--- 3.41 -Patient underwent cardiac catheterization has 40-50% first diagonal vessel disease.  Normal LVEF. -Dr. Yancey Flemings recommends starting him door given cocaine induced coronary vasospasm -Patient will discharge home later today  2.  Iron deficiency anemia -We will start patient on iron supplements -Workup as outpatient  3.  Polysubstance abuse Patient advised abstinence Urine drug screen positive for cocaine and cannabinoid  4.  Tobacco abuse Nicotine patch applied  Patient is advised to get primary care physician in the area.  Until then she will follow-up with a cardiologist as  outpatient.  CONSULTS OBTAINED:  Treatment Team:  Corey Skains, MD Dionisio David, MD  DRUG ALLERGIES:   Allergies  Allergen Reactions  . Penicillins Swelling    Has patient had a PCN reaction causing immediate rash, facial/tongue/throat swelling, SOB or lightheadedness with hypotension: Yes Has patient had a PCN reaction causing severe rash involving mucus membranes or skin necrosis: Unknown Has patient had a PCN reaction that required hospitalization: No Has patient had a PCN reaction occurring within the last 10 years: No If all of the above answers are "NO", then may proceed with Cephalosporin use.    DISCHARGE MEDICATIONS:   Allergies as of 12/03/2017      Reactions   Penicillins Swelling   Has patient had a PCN reaction causing immediate rash, facial/tongue/throat swelling, SOB or lightheadedness with hypotension: Yes Has patient had a PCN reaction causing severe rash involving mucus membranes or skin necrosis: Unknown Has patient had a PCN reaction that required hospitalization: No Has patient had a PCN reaction occurring within the last 10 years: No If all of the above answers are "NO", then may proceed with Cephalosporin use.      Medication List    STOP taking these medications   albuterol 108 (90 Base) MCG/ACT inhaler Commonly known as:  PROVENTIL HFA;VENTOLIN HFA   azithromycin 250 MG tablet Commonly known as:  ZITHROMAX Z-PAK   citalopram 40 MG tablet Commonly known as:  CELEXA   cyclobenzaprine 10 MG tablet Commonly known as:  FLEXERIL   divalproex 250 MG DR tablet Commonly known as:  DEPAKOTE   guaiFENesin 600 MG 12 hr tablet Commonly known as:  MUCINEX   ibuprofen 600 MG  tablet Commonly known as:  ADVIL,MOTRIN   ibuprofen 800 MG tablet Commonly known as:  ADVIL,MOTRIN   pantoprazole 40 MG tablet Commonly known as:  PROTONIX   predniSONE 20 MG tablet Commonly known as:  DELTASONE   traMADol 50 MG tablet Commonly known as:  ULTRAM    traZODone 50 MG tablet Commonly known as:  DESYREL     TAKE these medications   aspirin 81 MG EC tablet Take 1 tablet (81 mg total) by mouth daily. Start taking on:  12/04/2017   atorvastatin 40 MG tablet Commonly known as:  LIPITOR Take 1 tablet (40 mg total) by mouth daily at 6 PM.   esomeprazole 20 MG packet Commonly known as:  NEXIUM Take 20 mg by mouth daily before breakfast.   ferrous sulfate 325 (65 FE) MG tablet Take 1 tablet (325 mg total) by mouth 2 (two) times daily with a meal.   isosorbide mononitrate 30 MG 24 hr tablet Commonly known as:  IMDUR Take 1 tablet (30 mg total) by mouth daily.   nitroGLYCERIN 0.4 MG SL tablet Commonly known as:  NITROSTAT Place 1 tablet (0.4 mg total) under the tongue every 5 (five) minutes as needed for chest pain.       If you experience worsening of your admission symptoms, develop shortness of breath, life threatening emergency, suicidal or homicidal thoughts you must seek medical attention immediately by calling 911 or calling your MD immediately  if symptoms less severe.  You Must read complete instructions/literature along with all the possible adverse reactions/side effects for all the Medicines you take and that have been prescribed to you. Take any new Medicines after you have completely understood and accept all the possible adverse reactions/side effects.   Please note  You were cared for by a hospitalist during your hospital stay. If you have any questions about your discharge medications or the care you received while you were in the hospital after you are discharged, you can call the unit and asked to speak with the hospitalist on call if the hospitalist that took care of you is not available. Once you are discharged, your primary care physician will handle any further medical issues. Please note that NO REFILLS for any discharge medications will be authorized once you are discharged, as it is imperative that you return  to your primary care physician (or establish a relationship with a primary care physician if you do not have one) for your aftercare needs so that they can reassess your need for medications and monitor your lab values.   PHYSICAL EXAMINATION:  GENERAL:  46 y.o.-year-old patient lying in the bed with no acute distress.  EYES: Pupils equal, round, reactive to light and accommodation. No scleral icterus. Extraocular muscles intact.  HEENT: Head atraumatic, normocephalic. Oropharynx and nasopharynx clear.  NECK:  Supple, no jugular venous distention. No thyroid enlargement, no tenderness.  LUNGS: Normal breath sounds bilaterally, no wheezing, rales,rhonchi or crepitation. No use of accessory muscles of respiration.  CARDIOVASCULAR: S1, S2 normal. No murmurs, rubs, or gallops.  ABDOMEN: Soft, non-tender, non-distended. Bowel sounds present. No organomegaly or mass.  EXTREMITIES: No pedal edema, cyanosis, or clubbing.  NEUROLOGIC: Cranial nerves II through XII are intact. Muscle strength 5/5 in all extremities. Sensation intact. Gait not checked.  PSYCHIATRIC: The patient is alert and oriented x 3.  SKIN: No obvious rash, lesion, or ulcer.   DATA REVIEW:   CBC  Recent Labs  Lab 12/03/17 1057  WBC 3.7  HGB 8.5*  HCT 28.4*  PLT 409    Chemistries  Recent Labs  Lab 12/03/17 1057  NA 137  K 4.0  CL 111  CO2 22  GLUCOSE 83  BUN <5*  CREATININE 0.57  CALCIUM 7.9*    Microbiology Results   No results found for this or any previous visit (from the past 240 hour(s)).  RADIOLOGY:  Dg Chest 2 View  Result Date: 12/02/2017 CLINICAL DATA:  Increasing chest pain and tightness EXAM: CHEST  2 VIEW COMPARISON:  10/05/2017 FINDINGS: Mild hyperinflation, stable. Heart and mediastinal contours are within normal limits. No focal opacities or effusions. No acute bony abnormality. IMPRESSION: Stable mild hyperinflation.  No active cardiopulmonary disease. Electronically Signed   By: Rolm Baptise M.D.   On: 12/02/2017 11:26     Management plans discussed with the patient, family and they are in agreement.  CODE STATUS:     Code Status Orders  (From admission, onward)        Start     Ordered   12/02/17 1616  Full code  Continuous     12/02/17 1615    Code Status History    Date Active Date Inactive Code Status Order ID Comments User Context   10/30/2011 02:58 10/30/2011 14:24 Full Code [id number]  Palumbo-Rasch, April K, MD ED      TOTAL TIME TAKING CARE OF THIS PATIENT: *40* minutes.    Fritzi Mandes M.D on 12/03/2017 at 3:52 PM  Between 7am to 6pm - Pager - [phone] After 6pm go to [email or web address] - password [email or web address] [facility]  Office  [phone]  CC: Primary care physician; Patient, No Pcp Per

## 2017-12-06 ENCOUNTER — Encounter: Payer: Self-pay | Admitting: Cardiovascular Disease

## 2018-03-02 ENCOUNTER — Encounter: Payer: Self-pay | Admitting: Emergency Medicine

## 2018-03-02 ENCOUNTER — Emergency Department
Admission: EM | Admit: 2018-03-02 | Discharge: 2018-03-02 | Disposition: A | Discharge status: Home / Self Care | Payer: Self-pay | Attending: Student in an Organized Health Care Education/Training Program | Admitting: Student in an Organized Health Care Education/Training Program

## 2018-03-02 DIAGNOSIS — R197 Diarrhea, unspecified: Secondary | ICD-10-CM

## 2018-03-02 DIAGNOSIS — Z79899 Other long term (current) drug therapy: Secondary | ICD-10-CM | POA: Insufficient documentation

## 2018-03-02 DIAGNOSIS — J449 Chronic obstructive pulmonary disease, unspecified: Secondary | ICD-10-CM | POA: Insufficient documentation

## 2018-03-02 DIAGNOSIS — E860 Dehydration: Secondary | ICD-10-CM | POA: Insufficient documentation

## 2018-03-02 DIAGNOSIS — Z7982 Long term (current) use of aspirin: Secondary | ICD-10-CM | POA: Insufficient documentation

## 2018-03-02 DIAGNOSIS — F17210 Nicotine dependence, cigarettes, uncomplicated: Secondary | ICD-10-CM | POA: Insufficient documentation

## 2018-03-02 HISTORY — DX: Acute myocardial infarction, unspecified: I21.9

## 2018-03-02 LAB — URINALYSIS, COMPLETE (UACMP) WITH MICROSCOPIC
BACTERIA UA: NONE SEEN
Bilirubin Urine: NEGATIVE
Glucose, UA: NEGATIVE mg/dL
HGB URINE DIPSTICK: NEGATIVE
KETONES UR: NEGATIVE mg/dL
LEUKOCYTES UA: NEGATIVE
NITRITE: NEGATIVE
PROTEIN: NEGATIVE mg/dL
Specific Gravity, Urine: 1.016 (ref 1.005–1.030)
pH: 5 (ref 5.0–8.0)

## 2018-03-02 LAB — COMPREHENSIVE METABOLIC PANEL
ALBUMIN: 3.1 g/dL — AB (ref 3.5–5.0)
ALK PHOS: 81 U/L (ref 38–126)
ALT: 68 U/L — AB (ref 14–54)
AST: 74 U/L — AB (ref 15–41)
Anion gap: 4 — ABNORMAL LOW (ref 5–15)
BILIRUBIN TOTAL: 0.4 mg/dL (ref 0.3–1.2)
BUN: 13 mg/dL (ref 6–20)
CALCIUM: 8 mg/dL — AB (ref 8.9–10.3)
CO2: 21 mmol/L — ABNORMAL LOW (ref 22–32)
Chloride: 112 mmol/L — ABNORMAL HIGH (ref 101–111)
Creatinine, Ser: 0.57 mg/dL (ref 0.44–1.00)
GFR calc Af Amer: >60 mL/min (ref >60)
GLUCOSE: 91 mg/dL (ref 65–99)
POTASSIUM: 3.6 mmol/L (ref 3.5–5.1)
Sodium: 137 mmol/L (ref 135–145)
TOTAL PROTEIN: 6.4 g/dL — AB (ref 6.5–8.1)

## 2018-03-02 LAB — CBC
HEMATOCRIT: 29.7 % — AB (ref 35.0–47.0)
Hemoglobin: 8.8 g/dL — ABNORMAL LOW (ref 12.0–16.0)
MCH: 18.5 pg — ABNORMAL LOW (ref 26.0–34.0)
MCHC: 29.7 g/dL — AB (ref 32.0–36.0)
MCV: 62.4 fL — ABNORMAL LOW (ref 80.0–100.0)
Platelets: 410 10*3/uL (ref 150–440)
RBC: 4.76 MIL/uL (ref 3.80–5.20)
RDW: 19.5 % — ABNORMAL HIGH (ref 11.5–14.5)
WBC: 6.3 10*3/uL (ref 3.6–11.0)

## 2018-03-02 LAB — C DIFFICILE QUICK SCREEN W PCR REFLEX
C DIFFICLE (CDIFF) ANTIGEN: NEGATIVE
C Diff interpretation: NOT DETECTED
C Diff toxin: NEGATIVE

## 2018-03-02 LAB — LIPASE, BLOOD: Lipase: 68 U/L — ABNORMAL HIGH (ref 11–51)

## 2018-03-02 MED ORDER — SODIUM CHLORIDE 0.9 % IV BOLUS (SEPSIS)
1000.0000 mL | Freq: Once | INTRAVENOUS | Status: AC
  Administered 2018-03-02: 1000 mL via INTRAVENOUS

## 2018-03-02 NOTE — ED Notes (Signed)
Called for room x1. No answer at this time.

## 2018-03-02 NOTE — ED Notes (Signed)
Pt has vomiting and diarrhea for 2 weeks.  Pt states low abd pain also.  No back pain or urinary sx.  Pt reports diarrhea after eating each time.    Vomited x 1 today.  Pt alert.

## 2018-03-02 NOTE — ED Triage Notes (Signed)
Pt comes into the ED via PV c/o nausea and diarrhea x 2 weeks.  Patient thought it was a virus but states it is not getting any better.  States that sometimes she has abdominal pain but it is intermittent.  Patient states that every time she eats she has nausea.  Patient has even and unlabored respirations and is ambulatory to triage.

## 2018-03-02 NOTE — ED Provider Notes (Signed)
[facility] Emergency Department Provider Note    None    (approximate)  I have reviewed the triage vital signs and the nursing notes.   HISTORY  Chief Complaint Nausea and Diarrhea    HPI Denise CHOJNOWSKI is a 47 y.o. female with a history of substance abuse COPD and depression as well as recent admission for an STEMI presents with chief complaint of several weeks of watery persistent diarrhea where every time she eats she gets crampy discomfort and has nonbloody non-melanotic diarrhea.  Does also feel weak and dehydrated.  Denies any back pain.  No abdominal pain at this time.  No chest pain or shortness of breath.  Has been compliant with her medications.  Denies any recent substance abuse.  During admission in December had left heart cath which showed no evidence of stenosis requiring PCI.  Past Medical History:  Diagnosis Date  . COPD (chronic obstructive pulmonary disease) ([place])   . Depression   . MI (myocardial infarction) ([place])   . Substance abuse ([place])    No family history on file. Past Surgical History:  Procedure Laterality Date  . LEFT HEART CATH AND CORONARY ANGIOGRAPHY Right 12/03/2017   Procedure: LEFT HEART CATH AND CORONARY ANGIOGRAPHY;  Surgeon: Dionisio David, MD;  Location: [facility];  Service: Cardiovascular;  Laterality: Right;  . TUBAL LIGATION     Patient Active Problem List   Diagnosis Date Noted  . ACS (acute coronary syndrome) ([place]) 12/02/2017  . Lower extremity edema 11/26/2011  . ANEMIA, IRON DEFICIENCY 07/24/2010  . ANEMIA 03/05/2010  . BIPOLAR DISORDER UNSPECIFIED 01/16/2009  . DEPRESSION/ANXIETY 12/17/2008  . TOBACCO ABUSE 12/17/2008  . GASTROESOPHAGEAL REFLUX DISEASE 12/17/2008      Prior to Admission medications   Medication Sig Start Date End Date Taking? Authorizing Provider  aspirin EC 81 MG EC tablet Take 1 tablet (81 mg total) by mouth daily. 12/04/17   Fritzi Mandes, MD  atorvastatin (LIPITOR)  40 MG tablet Take 1 tablet (40 mg total) by mouth daily at 6 PM. 12/03/17   Fritzi Mandes, MD  esomeprazole (NEXIUM) 20 MG packet Take 20 mg by mouth daily before breakfast.    [provider]  ferrous sulfate 325 (65 FE) MG tablet Take 1 tablet (325 mg total) by mouth 2 (two) times daily with a meal. Patient not taking: Reported on 12/02/2017 10/19/14   Charlann Lange, PA-C  isosorbide mononitrate (IMDUR) 30 MG 24 hr tablet Take 1 tablet (30 mg total) by mouth daily. 12/03/17   Fritzi Mandes, MD  nitroGLYCERIN (NITROSTAT) 0.4 MG SL tablet Place 1 tablet (0.4 mg total) under the tongue every 5 (five) minutes as needed for chest pain. 12/03/17   Fritzi Mandes, MD    Allergies Penicillins    Social History Social History   Tobacco Use  . Smoking status: Current Every Day Smoker    Packs/day: 0.50    Types: Cigarettes  . Smokeless tobacco: Never Used  . Tobacco comment: Started smoking at age 37.   Substance Use Topics  . Alcohol use: No  . Drug use: Yes    Types: Marijuana, Cocaine    Comment: last use crack cocaine 02/28/18    Review of Systems Patient denies headaches, rhinorrhea, blurry vision, numbness, shortness of breath, chest pain, edema, cough, abdominal pain, nausea, vomiting, diarrhea, dysuria, fevers, rashes or hallucinations unless otherwise stated above in HPI. ____________________________________________   PHYSICAL EXAM:  VITAL SIGNS: Vitals:   03/02/18 1609  BP: (!) 127/108  Pulse: 85  Resp: 16  Temp: 98.3 F (36.8 C)  SpO2: 100%    Constitutional: Alert and oriented. Well appearing and in no acute distress. Eyes: Conjunctivae are normal.  Head: Atraumatic. Nose: No congestion/rhinnorhea. Mouth/Throat: Mucous membranes are moist.   Neck: No stridor. Painless ROM.  Cardiovascular: Normal rate, regular rhythm. Grossly normal heart sounds.  Good peripheral circulation. Respiratory: Normal respiratory effort.  No retractions. Lungs  CTAB. Gastrointestinal: Soft and nontender. No distention. No abdominal bruits. No CVA tenderness. Genitourinary:  Musculoskeletal: No lower extremity tenderness nor edema.  No joint effusions. Neurologic:  Normal speech and language. No gross focal neurologic deficits are appreciated. No facial droop Skin:  Skin is warm, dry and intact. No rash noted. Psychiatric: Mood and affect are normal. Speech and behavior are normal.  ____________________________________________   LABS (all labs ordered are listed, but only abnormal results are displayed)  Results for orders placed or performed during the hospital encounter of 03/02/18 (from the past 24 hour(s))  Lipase, blood     Status: Abnormal   Collection Time: 03/02/18  4:12 PM  Result Value Ref Range   Lipase 68 (H) 11 - 51 U/L  Comprehensive metabolic panel     Status: Abnormal   Collection Time: 03/02/18  4:12 PM  Result Value Ref Range   Sodium 137 135 - 145 mmol/L   Potassium 3.6 3.5 - 5.1 mmol/L   Chloride 112 (H) 101 - 111 mmol/L   CO2 21 (L) 22 - 32 mmol/L   Glucose, Bld 91 65 - 99 mg/dL   BUN 13 6 - 20 mg/dL   Creatinine, Ser 0.57 0.44 - 1.00 mg/dL   Calcium 8.0 (L) 8.9 - 10.3 mg/dL   Total Protein 6.4 (L) 6.5 - 8.1 g/dL   Albumin 3.1 (L) 3.5 - 5.0 g/dL   AST 74 (H) 15 - 41 U/L   ALT 68 (H) 14 - 54 U/L   Alkaline Phosphatase 81 38 - 126 U/L   Total Bilirubin 0.4 0.3 - 1.2 mg/dL   GFR calc non Af Amer >60 >60 mL/min   GFR calc Af Amer >60 >60 mL/min   Anion gap 4 (L) 5 - 15  CBC     Status: Abnormal   Collection Time: 03/02/18  4:12 PM  Result Value Ref Range   WBC 6.3 3.6 - 11.0 K/uL   RBC 4.76 3.80 - 5.20 MIL/uL   Hemoglobin 8.8 (L) 12.0 - 16.0 g/dL   HCT 29.7 (L) 35.0 - 47.0 %   MCV 62.4 (L) 80.0 - 100.0 fL   MCH 18.5 (L) 26.0 - 34.0 pg   MCHC 29.7 (L) 32.0 - 36.0 g/dL   RDW 19.5 (H) 11.5 - 14.5 %   Platelets 410 150 - 440 K/uL  Urinalysis, Complete w Microscopic     Status: Abnormal   Collection Time:  03/02/18  4:12 PM  Result Value Ref Range   Color, Urine YELLOW (A) YELLOW   APPearance CLEAR (A) CLEAR   Specific Gravity, Urine 1.016 1.005 - 1.030   pH 5.0 5.0 - 8.0   Glucose, UA NEGATIVE NEGATIVE mg/dL   Hgb urine dipstick NEGATIVE NEGATIVE   Bilirubin Urine NEGATIVE NEGATIVE   Ketones, ur NEGATIVE NEGATIVE mg/dL   Protein, ur NEGATIVE NEGATIVE mg/dL   Nitrite NEGATIVE NEGATIVE   Leukocytes, UA NEGATIVE NEGATIVE   RBC / HPF 0-5 0 - 5 RBC/hpf   WBC, UA 0-5 0 - 5 WBC/hpf  Bacteria, UA NONE SEEN NONE SEEN   Squamous Epithelial / LPF 0-5 (A) NONE SEEN  C difficile quick scan w PCR reflex     Status: None   Collection Time: 03/02/18  6:04 PM  Result Value Ref Range   C Diff antigen NEGATIVE NEGATIVE   C Diff toxin NEGATIVE NEGATIVE   C Diff interpretation No C. difficile detected.    ____________________________________________  EKG My review and personal interpretation at Time: 18:02   Indication: diarrhea  Rate: 70  Rhythm: sinus Axis: normal Other: normal intervals, unchanged from previous ____________________________________________  RADIOLOGY   ____________________________________________   PROCEDURES  Procedure(s) performed:  Procedures    Critical Care performed: no ____________________________________________   INITIAL IMPRESSION / ASSESSMENT AND PLAN / ED COURSE  Pertinent labs & imaging results that were available during my care of the patient were reviewed by me and considered in my medical decision making (see chart for details).  DDX: Enteritis, IBD, IBS, and neuropathy, infectious colitis, dehydration  KIMBERLE STANFILL is a 47 y.o. who presents to the ED with was as described above.  Patient well-appearing and in no acute distress.  Blood work does show borderline elevation of liver enzymes and lipase.  Not clinically consistent with pancreatitis or acute hepatitis.  Patient does appear dehydrated.  No evidence of acute blood loss anemia.  Stool  studies sent and showed no evidence of C. difficile.  Patient given IV fluids for dehydration with improvement in symptoms.  Formal stool PCR is not currently available but will be sent to [facility] which point results can be called back to patient as she is requesting discharge home.  This point I do believe she is stable and appropriate for outpatient follow-up.  Will give referral to GI.  Signs and symptoms for which she should return immediately to the hospital.       ____________________________________________   FINAL CLINICAL IMPRESSION(S) / ED DIAGNOSES  Final diagnoses:  Dehydration  Diarrhea, unspecified type      NEW MEDICATIONS STARTED DURING THIS VISIT:  New Prescriptions   No medications on file     Note:  This document was prepared using Dragon voice recognition software and may include unintentional dictation errors.    Merlyn Lot, MD 03/02/18 2051

## 2018-03-04 LAB — MISC LABCORP TEST (SEND OUT): LABCORP TEST CODE: 183480

## 2018-06-04 ENCOUNTER — Emergency Department (HOSPITAL_COMMUNITY)
Admission: EM | Admit: 2018-06-04 | Discharge: 2018-06-05 | Disposition: A | Discharge status: Home / Self Care | Payer: Self-pay | Attending: Emergency Medicine | Admitting: Emergency Medicine

## 2018-06-04 ENCOUNTER — Other Ambulatory Visit: Payer: Self-pay

## 2018-06-04 DIAGNOSIS — R079 Chest pain, unspecified: Secondary | ICD-10-CM

## 2018-06-04 DIAGNOSIS — I252 Old myocardial infarction: Secondary | ICD-10-CM | POA: Insufficient documentation

## 2018-06-04 DIAGNOSIS — F17210 Nicotine dependence, cigarettes, uncomplicated: Secondary | ICD-10-CM | POA: Insufficient documentation

## 2018-06-04 DIAGNOSIS — J449 Chronic obstructive pulmonary disease, unspecified: Secondary | ICD-10-CM | POA: Insufficient documentation

## 2018-06-04 DIAGNOSIS — R072 Precordial pain: Secondary | ICD-10-CM | POA: Insufficient documentation

## 2018-06-04 LAB — CBG MONITORING, ED: Glucose-Capillary: 88 mg/dL (ref 65–99)

## 2018-06-04 LAB — I-STAT BETA HCG BLOOD, ED (MC, WL, AP ONLY): I-stat hCG, quantitative: <5 m[IU]/mL (ref <5)

## 2018-06-04 MED ORDER — IPRATROPIUM-ALBUTEROL 0.5-2.5 (3) MG/3ML IN SOLN
3.0000 mL | Freq: Once | RESPIRATORY_TRACT | Status: AC
  Administered 2018-06-04: 3 mL via RESPIRATORY_TRACT
  Filled 2018-06-04: qty 3

## 2018-06-04 NOTE — ED Triage Notes (Addendum)
Patient was at her house and stood up and began having excruciating CP with SOB. Also c/o chills. Patient was given 324 asa and 1 nitro with no relief.

## 2018-06-04 NOTE — ED Provider Notes (Signed)
Emergency Department Provider Note   I have reviewed the triage vital signs and the nursing notes.   HISTORY  Chief Complaint Chest Pain   HPI Denise Alexander is a 47 y.o. female with multiple medical problems as documented below but most recently in end STEMI back in December the presents the emergency department today with chest pain.  Patient states that she has had a bunch of stress recently she was actually going to see 1 of her friends that she is been stressed about when she stood up she had a sharp excruciating chest pain substernally.  Had some shortness of breath because of the pain but not separate from it.  States she has had some fever and chills recently but felt like she might have sweated a little bit more when this was going on.  Called EMS because of her recent heart attack and they came and gave her aspirin and nitroglycerin and she feels better.  She states that she still feels anxious and stressed.  She coughs quite a bit but no increase in her cough recently.  She does have a history of COPD and uses inhalers at home.  She states lower extremity swelling but only at the end of the day does seem to better in the morning.  No recent productive cough.  No asymmetric swelling her legs. No other associated or modifying symptoms.    Past Medical History:  Diagnosis Date  . COPD (chronic obstructive pulmonary disease) ([place])   . Depression   . MI (myocardial infarction) ([place])   . Substance abuse [facility])     Patient Active Problem List   Diagnosis Date Noted  . ACS (acute coronary syndrome) ([place]) 12/02/2017  . Lower extremity edema 11/26/2011  . ANEMIA, IRON DEFICIENCY 07/24/2010  . ANEMIA 03/05/2010  . BIPOLAR DISORDER UNSPECIFIED 01/16/2009  . DEPRESSION/ANXIETY 12/17/2008  . TOBACCO ABUSE 12/17/2008  . GASTROESOPHAGEAL REFLUX DISEASE 12/17/2008    Past Surgical History:  Procedure Laterality Date  . LEFT HEART CATH AND CORONARY ANGIOGRAPHY Right 12/03/2017   Procedure: LEFT HEART CATH AND CORONARY ANGIOGRAPHY;  Surgeon: Dionisio David, MD;  Location: [facility];  Service: Cardiovascular;  Laterality: Right;  . TUBAL LIGATION      Current Outpatient Rx  . Order #: [id number] Class: Normal  . Order #: [id number] Class: Normal    Allergies Penicillins  No family history on file.  Social History Social History   Tobacco Use  . Smoking status: Current Every Day Smoker    Packs/day: 0.50    Types: Cigarettes  . Smokeless tobacco: Never Used  . Tobacco comment: Started smoking at age 28.   Substance Use Topics  . Alcohol use: No  . Drug use: Yes    Types: Marijuana, Cocaine    Comment: last use crack cocaine 02/28/18    Review of Systems  All other systems negative except as documented in the HPI. All pertinent positives and negatives as reviewed in the HPI. ____________________________________________   PHYSICAL EXAM:  VITAL SIGNS: ED Triage Vitals  Enc Vitals Group     BP 06/04/18 2313 (!) 127/99     Pulse Rate 06/04/18 2313 85     Resp 06/04/18 2313 20     Temp 06/04/18 2313 97.6 F (36.4 C)     Temp Source 06/04/18 2313 Oral     SpO2 06/04/18 2313 99 %     Weight 06/04/18 2312 105 lb (47.6 kg)     Height 06/04/18 2312  5' 2"  (1.575 m)    Constitutional: Alert and oriented. Well appearing and in no acute distress. Eyes: Conjunctivae are normal. PERRL. EOMI. Head: Atraumatic. Nose: No congestion/rhinnorhea. Mouth/Throat: Mucous membranes are moist.  Oropharynx non-erythematous. Neck: No stridor.  No meningeal signs.   Cardiovascular: Normal rate, regular rhythm. Good peripheral circulation. Grossly normal heart sounds.   Respiratory: Normal respiratory effort.  No retractions. Lungs slightly diminished and end expiratory wheezing. Gastrointestinal: Soft and nontender. No distention.  Musculoskeletal: No lower extremity tenderness nor edema. No gross deformities of extremities. Neurologic:  Normal speech and  language. No gross focal neurologic deficits are appreciated.  Skin:  Skin is warm, dry and intact. No rash noted.  ____________________________________________   LABS (all labs ordered are listed, but only abnormal results are displayed)  Labs Reviewed  COMPREHENSIVE METABOLIC PANEL - Abnormal; Notable for the following components:      Result Value   Creatinine, Ser 1.28 (*)    Calcium 8.5 (*)    Total Protein 6.4 (*)    Albumin 3.1 (*)    GFR calc non Af Amer 49 (*)    GFR calc Af Amer 57 (*)    All other components within normal limits  CBC - Abnormal; Notable for the following components:   Hemoglobin 8.3 (*)    HCT 29.9 (*)    MCV 67.3 (*)    MCH 18.7 (*)    MCHC 27.8 (*)    RDW 19.9 (*)    Platelets 411 (*)    All other components within normal limits  TROPONIN I  TROPONIN I  CBG MONITORING, ED  I-STAT BETA HCG BLOOD, ED ([facility], [facility], [facility] ONLY)   ____________________________________________  EKG   EKG Interpretation  Date/Time:  Saturday June 04 2018 23:07:17 EDT Ventricular Rate:  85 PR Interval:    QRS Duration: 83 QT Interval:  417 QTC Calculation: 496 R Axis:   -61 Text Interpretation:  Sinus rhythm Left axis deviation Low voltage, extremity leads Abnormal R-wave progression, late transition Borderline prolonged QT interval improved R wave progression compared to march 13 Confirmed by Merrily Pew [phone]) on 06/04/2018 11:37:15 PM       ____________________________________________  RADIOLOGY  Dg Chest 2 View  Result Date: 06/05/2018 CLINICAL DATA:  Chest pain, cough and short of breath EXAM: CHEST - 2 VIEW COMPARISON:  12/02/2017 FINDINGS: No acute consolidation or effusion. Normal heart size. No pneumothorax. Nodular opacity in the right lower lung, suspected nipple shadow. No pneumothorax. IMPRESSION: No active cardiopulmonary disease. Probable nipple shadow in the right lower lung, suggest short interval radiographic follow-up with nipple marker.  Electronically Signed   By: Donavan Foil M.D.   On: 06/05/2018 00:15    ____________________________________________   PROCEDURES  Procedure(s) performed:   Procedures   ____________________________________________   INITIAL IMPRESSION / ASSESSMENT AND PLAN / ED COURSE  Will eval to ensure not cardiac related however, seems more anxiety related. Will also give a couple breathing treatments for cough and expiratory wheezing/diminshed breath sounds.   Delta troponins. Persistently chest pain free. Stable for dc w/ cardiology follow up.  Pertinent labs & imaging results that were available during my care of the patient were reviewed by me and considered in my medical decision making (see chart for details).  ____________________________________________  FINAL CLINICAL IMPRESSION(S) / ED DIAGNOSES  Final diagnoses:  Nonspecific chest pain     MEDICATIONS GIVEN DURING THIS VISIT:  Medications  ipratropium-albuterol (DUONEB) 0.5-2.5 (3) MG/3ML nebulizer solution 3 mL (3  mLs Nebulization Given 06/04/18 2338)  sodium chloride 0.9 % bolus 1,000 mL (0 mLs Intravenous Stopped 06/05/18 0512)     NEW OUTPATIENT MEDICATIONS STARTED DURING THIS VISIT:  Discharge Medication List as of 06/05/2018  5:00 AM      Note:  This note was prepared with assistance of Dragon voice recognition software. Occasional wrong-word or sound-a-like substitutions may have occurred due to the inherent limitations of voice recognition software.   Finnis Colee, Corene Cornea, MD 06/05/18 0730

## 2018-06-05 ENCOUNTER — Emergency Department (HOSPITAL_COMMUNITY): Payer: Self-pay

## 2018-06-05 LAB — COMPREHENSIVE METABOLIC PANEL
ALBUMIN: 3.1 g/dL — AB (ref 3.5–5.0)
ALT: 35 U/L (ref 14–54)
AST: 36 U/L (ref 15–41)
Alkaline Phosphatase: 76 U/L (ref 38–126)
Anion gap: 10 (ref 5–15)
BUN: 14 mg/dL (ref 6–20)
CHLORIDE: 102 mmol/L (ref 101–111)
CO2: 24 mmol/L (ref 22–32)
CREATININE: 1.28 mg/dL — AB (ref 0.44–1.00)
Calcium: 8.5 mg/dL — ABNORMAL LOW (ref 8.9–10.3)
GFR calc non Af Amer: 49 mL/min — ABNORMAL LOW (ref >60)
GFR, EST AFRICAN AMERICAN: 57 mL/min — AB (ref >60)
GLUCOSE: 87 mg/dL (ref 65–99)
Potassium: 3.9 mmol/L (ref 3.5–5.1)
SODIUM: 136 mmol/L (ref 135–145)
Total Bilirubin: 0.3 mg/dL (ref 0.3–1.2)
Total Protein: 6.4 g/dL — ABNORMAL LOW (ref 6.5–8.1)

## 2018-06-05 LAB — CBC
HEMATOCRIT: 29.9 % — AB (ref 36.0–46.0)
Hemoglobin: 8.3 g/dL — ABNORMAL LOW (ref 12.0–15.0)
MCH: 18.7 pg — AB (ref 26.0–34.0)
MCHC: 27.8 g/dL — ABNORMAL LOW (ref 30.0–36.0)
MCV: 67.3 fL — AB (ref 78.0–100.0)
PLATELETS: 411 10*3/uL — AB (ref 150–400)
RBC: 4.44 MIL/uL (ref 3.87–5.11)
RDW: 19.9 % — ABNORMAL HIGH (ref 11.5–15.5)
WBC: 5 10*3/uL (ref 4.0–10.5)

## 2018-06-05 LAB — TROPONIN I: Troponin I: <0.03 ng/mL (ref <0.03)

## 2018-06-05 MED ORDER — SODIUM CHLORIDE 0.9 % IV BOLUS
1000.0000 mL | Freq: Once | INTRAVENOUS | Status: AC
  Administered 2018-06-05: 1000 mL via INTRAVENOUS

## 2018-06-05 NOTE — ED Notes (Signed)
Patient transported to X-ray 

## 2019-01-10 ENCOUNTER — Emergency Department
Admission: EM | Admit: 2019-01-10 | Discharge: 2019-01-10 | Disposition: A | Discharge status: Home / Self Care | Payer: Self-pay | Attending: Emergency Medicine | Admitting: Emergency Medicine

## 2019-01-10 ENCOUNTER — Other Ambulatory Visit: Payer: Self-pay

## 2019-01-10 ENCOUNTER — Encounter: Payer: Self-pay | Admitting: Emergency Medicine

## 2019-01-10 DIAGNOSIS — F1410 Cocaine abuse, uncomplicated: Secondary | ICD-10-CM | POA: Insufficient documentation

## 2019-01-10 DIAGNOSIS — R220 Localized swelling, mass and lump, head: Secondary | ICD-10-CM | POA: Insufficient documentation

## 2019-01-10 DIAGNOSIS — Z7982 Long term (current) use of aspirin: Secondary | ICD-10-CM | POA: Insufficient documentation

## 2019-01-10 DIAGNOSIS — I252 Old myocardial infarction: Secondary | ICD-10-CM | POA: Insufficient documentation

## 2019-01-10 DIAGNOSIS — F17210 Nicotine dependence, cigarettes, uncomplicated: Secondary | ICD-10-CM | POA: Insufficient documentation

## 2019-01-10 DIAGNOSIS — J449 Chronic obstructive pulmonary disease, unspecified: Secondary | ICD-10-CM | POA: Insufficient documentation

## 2019-01-10 DIAGNOSIS — F1210 Cannabis abuse, uncomplicated: Secondary | ICD-10-CM | POA: Insufficient documentation

## 2019-01-10 DIAGNOSIS — K047 Periapical abscess without sinus: Secondary | ICD-10-CM | POA: Insufficient documentation

## 2019-01-10 MED ORDER — CLINDAMYCIN PHOSPHATE 600 MG/4ML IJ SOLN
600.0000 mg | Freq: Once | INTRAMUSCULAR | Status: AC
  Administered 2019-01-10: 600 mg via INTRAMUSCULAR
  Filled 2019-01-10: qty 4

## 2019-01-10 MED ORDER — TRAMADOL HCL 50 MG PO TABS
50.0000 mg | ORAL_TABLET | Freq: Once | ORAL | Status: AC
  Administered 2019-01-10: 50 mg via ORAL
  Filled 2019-01-10: qty 1

## 2019-01-10 MED ORDER — CLINDAMYCIN HCL 150 MG PO CAPS: 300.0000 mg | ORAL_CAPSULE | Freq: Three times a day (TID) | ORAL | 0 refills | Status: DC

## 2019-01-10 MED ORDER — TRAMADOL HCL 50 MG PO TABS: 50.0000 mg | ORAL_TABLET | Freq: Four times a day (QID) | ORAL | 0 refills | Status: DC | PRN

## 2019-01-10 NOTE — ED Notes (Signed)
PT with noted swelling to right jawline.  Pt crying on assessment.  States pain for last 3 days and swelling that started this AM.

## 2019-01-10 NOTE — ED Notes (Signed)
See triage note  Presents with swelling to right jaw area  states she broke a tooth couple of days ago  Woke up with increased pain and swelling  Pt is tearful

## 2019-01-10 NOTE — Discharge Instructions (Addendum)
OPTIONS FOR DENTAL FOLLOW UP CARE ° °[place] Department of Health and Human Services - Local Safety Net Dental Clinics °http://www.ncdhhs.gov/dph/oralhealth/services/safetynetclinics.htm °  °[facility] (336-562-3123) ° °[facility] (919-933-9087) ° °[facility] ([phone] ext [phone]) ° °[facility] ([phone]) ° °[facility] ([phone]) °This clinic caters to the indigent population and is on a lottery system. °Location: °[facility], [address], [place] °Clinic Hours: °Wednesdays from 6pm - 9pm, patients seen by a lottery system. °For dates, call or go to www.med.[facility].edu/[facility]/patients/Dental-[facility] °Services: °Cleanings, fillings and simple extractions. °Payment Options: °DENTAL WORK IS FREE OF CHARGE. Bring proof of income or support. °Best way to get seen: °Arrive at 5:15 pm - this is a lottery, NOT first come/first serve, so arriving earlier will not increase your chances of being seen. °  °  °[facility] °[phone] °Select option 1 for emergencies °  °Location: °[facility], [address], [place] °Clinic Hours: °No walk-ins accepted - call the day before to schedule an appointment. °Check in times are 9:30 am and 1:30 pm. °Services: °Simple extractions, temporary fillings, pulpectomy/pulp debridement, uncomplicated abscess drainage. °Payment Options: °PAYMENT IS DUE AT THE TIME OF SERVICE.  Fee is usually $100-200, additional surgical procedures (e.g. abscess drainage) may be extra. °Cash, checks, Visa/MasterCard accepted.  Can file Medicaid if patient is covered for dental - patient should call case worker to check. °No discount for [facility] patients. °Best way to get seen: °MUST call the day before and get onto the schedule. Can usually be seen the next 1-2 days. No walk-ins accepted. °  °  °[facility] °[phone] °   °Location: °[facility], [address], [place] °Clinic Hours: °M, W, Th, F 8am or 1:30pm, Tues 9a or 1:30 - first come/first served. °Services: °Simple extractions, temporary fillings, uncomplicated abscess drainage.  You do not need to be an [place] resident. °Payment Options: °PAYMENT IS DUE AT THE TIME OF SERVICE. °Dental insurance, otherwise sliding scale - bring proof of income or support. °Depending on income and treatment needed, cost is usually $50-200. °Best way to get seen: °Arrive early as it is first come/first served. °  °  °[facility] °[phone] °  °Location: °[address] °Clinic Hours: °Mon-Thu 8a-5p °Services: °Most basic dental services including extractions and fillings. °Payment Options: °PAYMENT IS DUE AT THE TIME OF SERVICE. °Sliding scale, up to 50% off - bring proof if income or support. °Medicaid with dental option accepted. °Best way to get seen: °Call to schedule an appointment, can usually be seen within 2 weeks OR they will try to see walk-ins - show up at 8a or 2p (you may have to wait). °  °  °[facility] °919-245-2435 °[place] RESIDENTS ONLY °  °Location: °[facility], [address], [place],  [address] °Clinic Hours: By appointment only. °Monday - Thursday 8am-5pm, Friday 8am-12pm °Services: Cleanings, fillings, extractions. °Payment Options: °PAYMENT IS DUE AT THE TIME OF SERVICE. °Cash, Visa or MasterCard. Sliding scale - $30 minimum per service. °Best way to get seen: °Come in to office, complete packet and make an appointment - need proof of income °or support monies for each household member and proof of [place] residence. °Usually takes about a month to get in. °  °  °[facility] °[phone] °  °Location: °[address]   [place] °Clinic Hours: Walk-in Urgent Care Dental Services are offered Monday-Friday  mornings only. °The numbers of emergencies accepted daily is limited to the number of °providers available. °Maximum 15 - Mondays, Wednesdays & Thursdays °Maximum 10 - Tuesdays & Fridays °Services: °You do not need to be a [place] resident to be seen for a dental emergency. °Emergencies are defined as pain, swelling, abnormal bleeding, or dental trauma. Walkins will receive x-rays if needed. °NOTE: Dental cleaning is not an emergency. °Payment Options: °PAYMENT IS DUE AT THE TIME OF SERVICE. °Minimum co-pay is $40.00 for uninsured patients. °Minimum co-pay is $3.00 for Medicaid with dental coverage. °Dental Insurance is accepted and must be presented at time of visit. °Medicare does not cover dental. °Forms of payment: Cash, credit card, checks. °Best way to get seen: °If not previously registered with the clinic, walk-in dental registration begins at 7:15 am and is on a first come/first serve basis. °If previously registered with the clinic, call to make an appointment. °  °  °[facility] °[phone] °[place] RESIDENTS ONLY °  °Location: °[address], [place], [place] °Clinic Hours: °Mon-Thu 10a-2p °Services: Extractions only! °Payment Options: °FREE (donations accepted) - bring proof of income or support °Best way to get seen: °Call and schedule an appointment OR come at 8am on the 1st Monday of every month (except for holidays) when it is first come/first served. °  °  °[facility] °[phone] °  °Location: °[address], [place] °Clinic Hours: °Friday mornings °Services, Payment Options, Best way to get seen: °Call for info °

## 2019-01-10 NOTE — ED Provider Notes (Signed)
[facility] Emergency Department Provider Note  ____________________________________________   First MD Initiated Contact with Patient 01/10/19 1134     (approximate)  I have reviewed the triage vital signs and the nursing notes.   HISTORY  Chief Complaint Oral Swelling and Dental Pain    HPI Denise Alexander is a 48 y.o. female presents emergency department complaining of right-sided jaw pain.  She states that the swelling started 3 days ago.  She states she broke off a tooth.  Fever, chills, chest pain, or shortness of breath.  However she states that she does use meth and last used 2 days ago.  She denies any IV drug use.    Past Medical History:  Diagnosis Date  . COPD (chronic obstructive pulmonary disease) ([place])   . Depression   . MI (myocardial infarction) ([place])   . Substance abuse [facility])     Patient Active Problem List   Diagnosis Date Noted  . ACS (acute coronary syndrome) ([place]) 12/02/2017  . Lower extremity edema 11/26/2011  . ANEMIA, IRON DEFICIENCY 07/24/2010  . ANEMIA 03/05/2010  . BIPOLAR DISORDER UNSPECIFIED 01/16/2009  . DEPRESSION/ANXIETY 12/17/2008  . TOBACCO ABUSE 12/17/2008  . GASTROESOPHAGEAL REFLUX DISEASE 12/17/2008    Past Surgical History:  Procedure Laterality Date  . LEFT HEART CATH AND CORONARY ANGIOGRAPHY Right 12/03/2017   Procedure: LEFT HEART CATH AND CORONARY ANGIOGRAPHY;  Surgeon: Dionisio David, MD;  Location: [facility];  Service: Cardiovascular;  Laterality: Right;  . TUBAL LIGATION      Prior to Admission medications   Medication Sig Start Date End Date Taking? Authorizing Provider  aspirin EC 81 MG EC tablet Take 1 tablet (81 mg total) by mouth daily. 12/04/17   Fritzi Mandes, MD  clindamycin (CLEOCIN) 150 MG capsule Take 2 capsules (300 mg total) by mouth 3 (three) times daily. 01/10/19   Kimya Mccahill, Linden Dolin, PA-C  nitroGLYCERIN (NITROSTAT) 0.4 MG SL tablet Place 1 tablet (0.4 mg total) under the  tongue every 5 (five) minutes as needed for chest pain. 12/03/17   Fritzi Mandes, MD  traMADol (ULTRAM) 50 MG tablet Take 1 tablet (50 mg total) by mouth every 6 (six) hours as needed. 01/10/19   Versie Starks, PA-C    Allergies Penicillins  No family history on file.  Social History Social History   Tobacco Use  . Smoking status: Current Every Day Smoker    Packs/day: 0.50    Types: Cigarettes  . Smokeless tobacco: Never Used  . Tobacco comment: Started smoking at age 21.   Substance Use Topics  . Alcohol use: No  . Drug use: Yes    Types: Marijuana, Cocaine    Comment: lasr use was about 3 days ago    Review of Systems  Constitutional: No fever/chills Eyes: No visual changes. ENT: No sore throat.  Positive tooth pain and facial swelling Respiratory: Denies cough Genitourinary: Negative for dysuria. Musculoskeletal: Negative for back pain. Skin: Negative for rash.    ____________________________________________   PHYSICAL EXAM:  VITAL SIGNS: ED Triage Vitals [01/10/19 1034]  Enc Vitals Group     BP (!) 138/99     Pulse Rate 85     Resp 20     Temp 98.7 F (37.1 C)     Temp Source Oral     SpO2 98 %     Weight 110 lb (49.9 kg)     Height 5' 2"  (1.575 m)     Head Circumference  Peak Flow      Pain Score 10     Pain Loc      Pain Edu?      Excl. in [place]?     Constitutional: Alert and oriented. Well appearing and in no acute distress. Eyes: Conjunctivae are normal.  Head: Atraumatic.  Positive right-sided facial swelling along the right lower jaw Nose: No congestion/rhinnorhea. Mouth/Throat: Mucous membranes are moist.  Poor dentition throughout.  Gum swelling around the right lower molars Neck:  supple no lymphadenopathy noted Cardiovascular: Normal rate, regular rhythm. Heart sounds are normal Respiratory: Normal respiratory effort.  No retractions, lungs c t a  GU: deferred Musculoskeletal: FROM all extremities, warm and well  perfused Neurologic:  Normal speech and language.  Skin:  Skin is warm, dry and intact. No rash noted. Psychiatric: Mood and affect are normal. Speech and behavior are normal.  ____________________________________________   LABS (all labs ordered are listed, but only abnormal results are displayed)  Labs Reviewed - No data to display ____________________________________________   ____________________________________________  RADIOLOGY    ____________________________________________   PROCEDURES  Procedure(s) performed: Clindamycin 600 mg IM, tramadol 1 p.o.  Procedures    ____________________________________________   INITIAL IMPRESSION / ASSESSMENT AND PLAN / ED COURSE  Pertinent labs & imaging results that were available during my care of the patient were reviewed by me and considered in my medical decision making (see chart for details).   Patient is a 48 year old female presents emergency department complaint of dental pain and facial swelling.  Physical exam shows that the right side of face is swollen along the right lower jaw along with poor dentition.  Clindamycin 600 mg IM, tramadol 1 p.o.  Patient was given prescription for clindamycin and tramadol.  She is to follow-up with her regular doctor if not better in 3 days.  List of dental clinics was provided for the patient.  She states she understands will comply.  She is discharged stable condition.     As part of my medical decision making, I reviewed the following data within the Secretary notes reviewed and incorporated, Old chart reviewed, Notes from prior ED visits and [place] Controlled Substance Database  ____________________________________________   FINAL CLINICAL IMPRESSION(S) / ED DIAGNOSES  Final diagnoses:  Dental abscess      NEW MEDICATIONS STARTED DURING THIS VISIT:  New Prescriptions   CLINDAMYCIN (CLEOCIN) 150 MG CAPSULE    Take 2 capsules (300 mg total) by  mouth 3 (three) times daily.   TRAMADOL (ULTRAM) 50 MG TABLET    Take 1 tablet (50 mg total) by mouth every 6 (six) hours as needed.     Note:  This document was prepared using Dragon voice recognition software and may include unintentional dictation errors.    Versie Starks, PA-C 01/10/19 1215    Eula Listen, MD 01/10/19 1524

## 2019-01-10 NOTE — ED Triage Notes (Signed)
Patient states she broke a bottom jaw tooth on the right side 3 days ago and developed facial swelling during the night.  Obvious swelling right lower jaw.

## 2019-03-26 ENCOUNTER — Encounter: Payer: Self-pay | Admitting: Emergency Medicine

## 2019-03-26 ENCOUNTER — Emergency Department
Admission: EM | Admit: 2019-03-26 | Discharge: 2019-03-27 | Disposition: A | Discharge status: Home / Self Care | Payer: HRSA Program | Attending: Emergency Medicine | Admitting: Emergency Medicine

## 2019-03-26 ENCOUNTER — Other Ambulatory Visit: Payer: Self-pay

## 2019-03-26 DIAGNOSIS — J449 Chronic obstructive pulmonary disease, unspecified: Secondary | ICD-10-CM | POA: Insufficient documentation

## 2019-03-26 DIAGNOSIS — Z7982 Long term (current) use of aspirin: Secondary | ICD-10-CM | POA: Insufficient documentation

## 2019-03-26 DIAGNOSIS — R509 Fever, unspecified: Secondary | ICD-10-CM | POA: Diagnosis present

## 2019-03-26 DIAGNOSIS — Z20828 Contact with and (suspected) exposure to other viral communicable diseases: Secondary | ICD-10-CM | POA: Diagnosis not present

## 2019-03-26 DIAGNOSIS — B349 Viral infection, unspecified: Secondary | ICD-10-CM | POA: Diagnosis not present

## 2019-03-26 DIAGNOSIS — Z79899 Other long term (current) drug therapy: Secondary | ICD-10-CM | POA: Diagnosis not present

## 2019-03-26 DIAGNOSIS — F17210 Nicotine dependence, cigarettes, uncomplicated: Secondary | ICD-10-CM | POA: Diagnosis not present

## 2019-03-26 DIAGNOSIS — R0602 Shortness of breath: Secondary | ICD-10-CM

## 2019-03-26 MED ORDER — LORAZEPAM 2 MG/ML IJ SOLN
1.0000 mg | Freq: Once | INTRAMUSCULAR | Status: AC
  Administered 2019-03-27: 1 mg via INTRAVENOUS
  Filled 2019-03-26: qty 1

## 2019-03-26 MED ORDER — KETOROLAC TROMETHAMINE 30 MG/ML IJ SOLN
30.0000 mg | Freq: Once | INTRAMUSCULAR | Status: AC
  Administered 2019-03-27: 30 mg via INTRAVENOUS
  Filled 2019-03-26: qty 1

## 2019-03-26 NOTE — ED Provider Notes (Signed)
[facility] Emergency Department Provider Note    First MD Initiated Contact with Patient 03/26/19 2346     (approximate)  I have reviewed the triage vital signs and the nursing notes.   HISTORY  Chief Complaint Chest Pain    HPI Denise Alexander is a 48 y.o. female with medical history as listed below including COPD MRI 2019 and cocaine abuse most recent use of cocaine 4 days ago per patient presents to the emergency department with generalized body aches subjective fevers nonproductive cough nausea vomiting and diarrhea which patient states began today.  Of note patient states that her roommate is currently on isolation secondary to possible Covid 19.  Patient states generalized body aches is 10 out of 10        Past Medical History:  Diagnosis Date  . COPD (chronic obstructive pulmonary disease) ([place])   . Depression   . MI (myocardial infarction) ([place])   . Substance abuse [facility])     Patient Active Problem List   Diagnosis Date Noted  . ACS (acute coronary syndrome) ([place]) 12/02/2017  . Lower extremity edema 11/26/2011  . ANEMIA, IRON DEFICIENCY 07/24/2010  . ANEMIA 03/05/2010  . BIPOLAR DISORDER UNSPECIFIED 01/16/2009  . DEPRESSION/ANXIETY 12/17/2008  . TOBACCO ABUSE 12/17/2008  . GASTROESOPHAGEAL REFLUX DISEASE 12/17/2008    Past Surgical History:  Procedure Laterality Date  . LEFT HEART CATH AND CORONARY ANGIOGRAPHY Right 12/03/2017   Procedure: LEFT HEART CATH AND CORONARY ANGIOGRAPHY;  Surgeon: Dionisio David, MD;  Location: [facility];  Service: Cardiovascular;  Laterality: Right;  . TUBAL LIGATION      Prior to Admission medications   Medication Sig Start Date End Date Taking? Authorizing Provider  aspirin EC 81 MG EC tablet Take 1 tablet (81 mg total) by mouth daily. 12/04/17   Fritzi Mandes, MD  azithromycin (ZITHROMAX) 500 MG tablet Take 1 tablet (500 mg total) by mouth daily for 3 days. Take 1 tablet daily for 3 days.  03/27/19 03/30/19  Gregor Hams, MD  clindamycin (CLEOCIN) 150 MG capsule Take 2 capsules (300 mg total) by mouth 3 (three) times daily. 01/10/19   Fisher, Linden Dolin, PA-C  nitroGLYCERIN (NITROSTAT) 0.4 MG SL tablet Place 1 tablet (0.4 mg total) under the tongue every 5 (five) minutes as needed for chest pain. 12/03/17   Fritzi Mandes, MD  traMADol (ULTRAM) 50 MG tablet Take 1 tablet (50 mg total) by mouth every 6 (six) hours as needed. 01/10/19   Versie Starks, PA-C    Allergies Penicillins  No family history on file.  Social History Social History   Tobacco Use  . Smoking status: Current Every Day Smoker    Packs/day: 0.50    Types: Cigarettes  . Smokeless tobacco: Never Used  . Tobacco comment: Started smoking at age 33.   Substance Use Topics  . Alcohol use: No  . Drug use: Yes    Types: Marijuana, Cocaine    Comment: last use x 4 days     Review of Systems Constitutional: Positive for fever/chills Eyes: No visual changes. ENT: No sore throat. Cardiovascular: Denies chest pain. Respiratory: Positive for nonproductive cough and dyspnea Gastrointestinal: No abdominal pain.  No nausea, no vomiting.  No diarrhea.  No constipation. Genitourinary: Negative for dysuria. Musculoskeletal: Negative for neck pain.  Negative for back pain. Integumentary: Negative for rash. Neurological: Negative for headaches, focal weakness or numbness.  ____________________________________________   PHYSICAL EXAM:  VITAL SIGNS: ED Triage  Vitals  Enc Vitals Group     BP 03/26/19 2345 (!) 146/92     Pulse Rate 03/26/19 2345 94     Resp --      Temp 03/26/19 2345 98.6 F (37 C)     Temp Source 03/26/19 2345 Oral     SpO2 03/26/19 2345 99 %     Weight 03/26/19 2343 45.4 kg (100 lb)     Height 03/26/19 2343 1.575 m (5' 2" )     Head Circumference --      Peak Flow --      Pain Score 03/26/19 2342 10     Pain Loc --      Pain Edu? --      Excl. in [place]? --     Constitutional: Alert and  oriented. Well appearing and in no acute distress. Eyes: Conjunctivae are normal.  Mouth/Throat: Mucous membranes are moist. Oropharynx non-erythematous. Neck: No stridor.   Cardiovascular: Normal rate, regular rhythm. Good peripheral circulation. Grossly normal heart sounds. Respiratory: Normal respiratory effort.  No retractions. Lungs CTAB. Gastrointestinal: Soft and nontender. No distention.  Musculoskeletal: No lower extremity tenderness nor edema. No gross deformities of extremities. Neurologic:  Normal speech and language. No gross focal neurologic deficits are appreciated.  Skin:  Skin is warm, dry and intact. No rash noted. Psychiatric: Very agitated, anxious affect. ____________________________________________   LABS (all labs ordered are listed, but only abnormal results are displayed)  Labs Reviewed  CBC - Abnormal; Notable for the following components:      Result Value   Hemoglobin 7.7 (*)    HCT 28.3 (*)    MCV 64.8 (*)    MCH 17.6 (*)    MCHC 27.2 (*)    RDW 20.0 (*)    Platelets 469 (*)    All other components within normal limits  COMPREHENSIVE METABOLIC PANEL - Abnormal; Notable for the following components:   Glucose, Bld 108 (*)    BUN 23 (*)    Calcium 8.1 (*)    AST 43 (*)    All other components within normal limits  URINE DRUG SCREEN, QUALITATIVE ([facility] ONLY) - Abnormal; Notable for the following components:   Cocaine Metabolite,Ur [place] POSITIVE (*)    Cannabinoid 50 Ng, Ur [place] POSITIVE (*)    All other components within normal limits  NOVEL CORONAVIRUS, NAA (HOSPITAL ORDER, SEND-OUT TO REF LAB)  TROPONIN I  INFLUENZA PANEL BY PCR (TYPE A & B)  TROPONIN I  CK   ____________________________________________  EKG ED ECG REPORT I, [place] Artemio Dobie, the attending physician, personally viewed and interpreted this ECG.   Date: 03/27/2019  EKG Time: 11:45 PM  Rate: 94  Rhythm: Normal sinus rhythm  Axis: Normal  Intervals: Normal  ST&T Change: None   ____________________________________________  RADIOLOGY I, [place] N Beverly Suriano, personally viewed and evaluated these images (plain radiographs) as part of my medical decision making, as well as reviewing the written report by the radiologist.  ED MD interpretation: No active disease noted on chest x-ray.  Official radiology report(s): Dg Chest Port 1 View  Result Date: 03/27/2019 CLINICAL DATA:  Cough and fever EXAM: PORTABLE CHEST 1 VIEW COMPARISON:  06/05/2018 FINDINGS: The heart size and mediastinal contours are within normal limits. Both lungs are clear. The visualized skeletal structures are unremarkable. IMPRESSION: No active disease. Electronically Signed   By: Ulyses Jarred M.D.   On: 03/27/2019 00:31     Procedures   ____________________________________________   INITIAL IMPRESSION /  MDM / ASSESSMENT AND PLAN / ED COURSE  As part of my medical decision making, I reviewed the following data within the [place] was evaluated in Emergency Department on 03/27/2019 for the symptoms described in the history of present illness. She was evaluated in the context of the global COVID-19 pandemic, which necessitated consideration that the patient might be at risk for infection with the SARS-CoV-2 virus that causes COVID-19. Institutional protocols and algorithms that pertain to the evaluation of patients at risk for COVID-19 are in a state of rapid change based on information released by regulatory bodies including the CDC and federal and state organizations. These policies and algorithms were followed during the patient's care in the ED.      48 year old female presented above-stated history and physical exam secondary to forementioned symptoms.  Discussed the patient COVID-19 and the need for home quarantine.  Patient given information regarding such.  Laboratory data here unremarkable chest x-ray likewise negative.  Patient with a known history of COPD and so  patient symptoms may be secondary to acute on chronic bronchitis.  Patient given azithromycin albuterol for home. ____________________________________________  FINAL CLINICAL IMPRESSION(S) / ED DIAGNOSES  Final diagnoses:  Viral illness     MEDICATIONS GIVEN DURING THIS VISIT:  Medications  LORazepam (ATIVAN) injection 1 mg (1 mg Intravenous Given 03/27/19 0003)  ketorolac (TORADOL) 30 MG/ML injection 30 mg (30 mg Intravenous Given 03/27/19 0002)  acetaminophen (TYLENOL) tablet 650 mg (650 mg Oral Given 03/27/19 0111)  sodium chloride 0.9 % bolus 1,000 mL (0 mLs Intravenous Stopped 03/27/19 0409)     ED Discharge Orders         Ordered    azithromycin (ZITHROMAX) 500 MG tablet  Daily     03/27/19 0354           Note:  This document was prepared using Dragon voice recognition software and may include unintentional dictation errors.   Gregor Hams, MD 03/27/19 [phone]

## 2019-03-26 NOTE — ED Triage Notes (Signed)
Pt arrives via [facility] with c/o N?V which started Friday and diarrhea which began today. Pt reports using cocaine last four days ago and is experiencing chest discomfort and generalized body aches at this time.

## 2019-03-27 ENCOUNTER — Telehealth: Payer: Self-pay | Admitting: Emergency Medicine

## 2019-03-27 ENCOUNTER — Emergency Department: Payer: HRSA Program

## 2019-03-27 LAB — URINE DRUG SCREEN, QUALITATIVE (ARMC ONLY)
Amphetamines, Ur Screen: NOT DETECTED
Barbiturates, Ur Screen: NOT DETECTED
Benzodiazepine, Ur Scrn: NOT DETECTED
Cannabinoid 50 Ng, Ur ~~LOC~~: POSITIVE — AB
Cocaine Metabolite,Ur ~~LOC~~: POSITIVE — AB
MDMA (Ecstasy)Ur Screen: NOT DETECTED
Methadone Scn, Ur: NOT DETECTED
Opiate, Ur Screen: NOT DETECTED
Phencyclidine (PCP) Ur S: NOT DETECTED
Tricyclic, Ur Screen: NOT DETECTED

## 2019-03-27 LAB — CBC
HCT: 28.3 % — ABNORMAL LOW (ref 36.0–46.0)
Hemoglobin: 7.7 g/dL — ABNORMAL LOW (ref 12.0–15.0)
MCH: 17.6 pg — ABNORMAL LOW (ref 26.0–34.0)
MCHC: 27.2 g/dL — ABNORMAL LOW (ref 30.0–36.0)
MCV: 64.8 fL — ABNORMAL LOW (ref 80.0–100.0)
Platelets: 469 10*3/uL — ABNORMAL HIGH (ref 150–400)
RBC: 4.37 MIL/uL (ref 3.87–5.11)
RDW: 20 % — ABNORMAL HIGH (ref 11.5–15.5)
WBC: 9.7 10*3/uL (ref 4.0–10.5)
nRBC: 0 % (ref 0.0–0.2)

## 2019-03-27 LAB — CK: Total CK: 205 U/L (ref 38–234)

## 2019-03-27 LAB — INFLUENZA PANEL BY PCR (TYPE A & B)
Influenza A By PCR: NEGATIVE
Influenza B By PCR: NEGATIVE

## 2019-03-27 LAB — COMPREHENSIVE METABOLIC PANEL
ALT: 36 U/L (ref 0–44)
AST: 43 U/L — ABNORMAL HIGH (ref 15–41)
Albumin: 3.5 g/dL (ref 3.5–5.0)
Alkaline Phosphatase: 75 U/L (ref 38–126)
Anion gap: 7 (ref 5–15)
BUN: 23 mg/dL — ABNORMAL HIGH (ref 6–20)
CO2: 25 mmol/L (ref 22–32)
Calcium: 8.1 mg/dL — ABNORMAL LOW (ref 8.9–10.3)
Chloride: 103 mmol/L (ref 98–111)
Creatinine, Ser: 0.64 mg/dL (ref 0.44–1.00)
GFR calc Af Amer: >60 mL/min (ref >60)
GFR calc non Af Amer: >60 mL/min (ref >60)
Glucose, Bld: 108 mg/dL — ABNORMAL HIGH (ref 70–99)
Potassium: 4.2 mmol/L (ref 3.5–5.1)
Sodium: 135 mmol/L (ref 135–145)
Total Bilirubin: 0.3 mg/dL (ref 0.3–1.2)
Total Protein: 6.8 g/dL (ref 6.5–8.1)

## 2019-03-27 LAB — TROPONIN I
Troponin I: <0.03 ng/mL (ref <0.03)
Troponin I: <0.03 ng/mL (ref <0.03)

## 2019-03-27 MED ORDER — SODIUM CHLORIDE 0.9 % IV BOLUS
1000.0000 mL | Freq: Once | INTRAVENOUS | Status: AC
  Administered 2019-03-27: 1000 mL via INTRAVENOUS

## 2019-03-27 MED ORDER — AZITHROMYCIN 500 MG PO TABS: 500.0000 mg | ORAL_TABLET | Freq: Every day | ORAL | 0 refills | Status: DC

## 2019-03-27 MED ORDER — ACETAMINOPHEN 325 MG PO TABS
650.0000 mg | ORAL_TABLET | Freq: Once | ORAL | Status: AC
  Administered 2019-03-27: 650 mg via ORAL
  Filled 2019-03-27: qty 2

## 2019-03-27 MED ORDER — AZITHROMYCIN 250 MG PO TABS: ORAL_TABLET | ORAL | 0 refills | Status: AC

## 2019-03-27 NOTE — Discharge Instructions (Addendum)
Person Under Monitoring Name: Denise Alexander  Location: [place] [address]   Infection Prevention Recommendations for Individuals Confirmed to have, or Being Evaluated for, 2019 Novel Coronavirus (COVID-19) Infection Who Receive Care at Home  Individuals who are confirmed to have, or are being evaluated for, COVID-19 should follow the prevention steps below until a healthcare provider or local or state health department says they can return to normal activities.  Stay home except to get medical care You should restrict activities outside your home, except for getting medical care. Do not go to work, school, or public areas, and do not use public transportation or taxis.  Call ahead before visiting your doctor Before your medical appointment, call the healthcare provider and tell them that you have, or are being evaluated for, COVID-19 infection. This will help the healthcare providers office take steps to keep other people from getting infected. Ask your healthcare provider to call the local or state health department.  Monitor your symptoms Seek prompt medical attention if your illness is worsening (e.g., difficulty breathing). Before going to your medical appointment, call the healthcare provider and tell them that you have, or are being evaluated for, COVID-19 infection. Ask your healthcare provider to call the local or state health department.  Wear a facemask You should wear a facemask that covers your nose and mouth when you are in the same room with other people and when you visit a healthcare provider. People who live with or visit you should also wear a facemask while they are in the same room with you.  Separate yourself from other people in your home As much as possible, you should stay in a different room from other people in your home. Also, you should use a separate bathroom, if available.  Avoid sharing household items You should not  share dishes, drinking glasses, cups, eating utensils, towels, bedding, or other items with other people in your home. After using these items, you should wash them thoroughly with soap and water.  Cover your coughs and sneezes Cover your mouth and nose with a tissue when you cough or sneeze, or you can cough or sneeze into your sleeve. Throw used tissues in a lined trash can, and immediately wash your hands with soap and water for at least 20 seconds or use an alcohol-based hand rub.  Wash your [facility] your hands often and thoroughly with soap and water for at least 20 seconds. You can use an alcohol-based hand sanitizer if soap and water are not available and if your hands are not visibly dirty. Avoid touching your eyes, nose, and mouth with unwashed hands.   Prevention Steps for Caregivers and Household Members of Individuals Confirmed to have, or Being Evaluated for, COVID-19 Infection Being Cared for in the Home  If you live with, or provide care at home for, a person confirmed to have, or being evaluated for, COVID-19 infection please follow these guidelines to prevent infection:  Follow healthcare providers instructions Make sure that you understand and can help the patient follow any healthcare provider instructions for all care.  Provide for the patients basic needs You should help the patient with basic needs in the home and provide support for getting groceries, prescriptions, and other personal needs.  Monitor the patients symptoms If they are getting sicker, call his or her medical provider and tell them that the patient has, or is being evaluated for, COVID-19 infection. This will help the healthcare providers  office take steps to keep other people from getting infected. Ask the healthcare provider to call the local or state health department.  Limit the number of people who have contact with the patient If possible, have only one caregiver for the  patient. Other household members should stay in another home or place of residence. If this is not possible, they should stay in another room, or be separated from the patient as much as possible. Use a separate bathroom, if available. Restrict visitors who do not have an essential need to be in the home.  Keep older adults, very young children, and other sick people away from the patient Keep older adults, very young children, and those who have compromised immune systems or chronic health conditions away from the patient. This includes people with chronic heart, lung, or kidney conditions, diabetes, and cancer.  Ensure good ventilation Make sure that shared spaces in the home have good air flow, such as from an air conditioner or an opened window, weather permitting.  Wash your hands often Wash your hands often and thoroughly with soap and water for at least 20 seconds. You can use an alcohol based hand sanitizer if soap and water are not available and if your hands are not visibly dirty. Avoid touching your eyes, nose, and mouth with unwashed hands. Use disposable paper towels to dry your hands. If not available, use dedicated cloth towels and replace them when they become wet.  Wear a facemask and gloves Wear a disposable facemask at all times in the room and gloves when you touch or have contact with the patients blood, body fluids, and/or secretions or excretions, such as sweat, saliva, sputum, nasal mucus, vomit, urine, or feces.  Ensure the mask fits over your nose and mouth tightly, and do not touch it during use. Throw out disposable facemasks and gloves after using them. Do not reuse. Wash your hands immediately after removing your facemask and gloves. If your personal clothing becomes contaminated, carefully remove clothing and launder. Wash your hands after handling contaminated clothing. Place all used disposable facemasks, gloves, and other waste in a lined container before  disposing them with other household waste. Remove gloves and wash your hands immediately after handling these items.  Do not share dishes, glasses, or other household items with the patient Avoid sharing household items. You should not share dishes, drinking glasses, cups, eating utensils, towels, bedding, or other items with a patient who is confirmed to have, or being evaluated for, COVID-19 infection. After the person uses these items, you should wash them thoroughly with soap and water.  Wash laundry thoroughly Immediately remove and wash clothes or bedding that have blood, body fluids, and/or secretions or excretions, such as sweat, saliva, sputum, nasal mucus, vomit, urine, or feces, on them. Wear gloves when handling laundry from the patient. Read and follow directions on labels of laundry or clothing items and detergent. In general, wash and dry with the warmest temperatures recommended on the label.  Clean all areas the individual has used often Clean all touchable surfaces, such as counters, tabletops, doorknobs, bathroom fixtures, toilets, phones, keyboards, tablets, and bedside tables, every day. Also, clean any surfaces that may have blood, body fluids, and/or secretions or excretions on them. Wear gloves when cleaning surfaces the patient has come in contact with. Use a diluted bleach solution (e.g., dilute bleach with 1 part bleach and 10 parts water) or a household disinfectant with a label that says EPA-registered for coronaviruses. To make a  bleach solution at home, add 1 tablespoon of bleach to 1 quart (4 cups) of water. For a larger supply, add  cup of bleach to 1 gallon (16 cups) of water. Read labels of cleaning products and follow recommendations provided on product labels. Labels contain instructions for safe and effective use of the cleaning product including precautions you should take when applying the product, such as wearing gloves or eye protection and making sure you  have good ventilation during use of the product. Remove gloves and wash hands immediately after cleaning.  Monitor yourself for signs and symptoms of illness Caregivers and household members are considered close contacts, should monitor their health, and will be asked to limit movement outside of the home to the extent possible. Follow the monitoring steps for close contacts listed on the symptom monitoring form.   ? If you have additional questions, contact your local health department or call the epidemiologist on call at [phone] (available 24/7). ? This guidance is subject to change. For the most up-to-date guidance from [facility], please refer to their website: YouBlogs.pl

## 2019-03-27 NOTE — ED Notes (Signed)
Pt is lying room prone at this time snoring loudly in NAD.

## 2019-03-27 NOTE — ED Notes (Signed)
MD Brown at bedside.

## 2019-03-29 ENCOUNTER — Telehealth: Payer: Self-pay | Admitting: Emergency Medicine

## 2019-03-29 LAB — NOVEL CORONAVIRUS, NAA (HOSP ORDER, SEND-OUT TO REF LAB; TAT 18-24 HRS): SARS-CoV-2, NAA: NOT DETECTED

## 2019-03-29 NOTE — Telephone Encounter (Signed)
Called patient to inform of covid 19 result.  The number provided for  Home and cell  is a business.

## 2019-11-28 ENCOUNTER — Other Ambulatory Visit: Payer: Self-pay

## 2019-11-28 DIAGNOSIS — Z20822 Contact with and (suspected) exposure to covid-19: Secondary | ICD-10-CM

## 2019-11-30 LAB — NOVEL CORONAVIRUS, NAA: SARS-CoV-2, NAA: NOT DETECTED

## 2020-10-11 ENCOUNTER — Emergency Department
Admission: EM | Admit: 2020-10-11 | Discharge: 2020-10-11 | Disposition: A | Discharge status: Home / Self Care | Payer: Self-pay | Attending: Student in an Organized Health Care Education/Training Program | Admitting: Student in an Organized Health Care Education/Training Program

## 2020-10-11 ENCOUNTER — Other Ambulatory Visit: Payer: Self-pay | Admitting: Student in an Organized Health Care Education/Training Program

## 2020-10-11 ENCOUNTER — Emergency Department: Payer: Self-pay

## 2020-10-11 ENCOUNTER — Other Ambulatory Visit: Payer: Self-pay

## 2020-10-11 DIAGNOSIS — J441 Chronic obstructive pulmonary disease with (acute) exacerbation: Secondary | ICD-10-CM | POA: Insufficient documentation

## 2020-10-11 DIAGNOSIS — Z7982 Long term (current) use of aspirin: Secondary | ICD-10-CM | POA: Insufficient documentation

## 2020-10-11 DIAGNOSIS — R110 Nausea: Secondary | ICD-10-CM | POA: Insufficient documentation

## 2020-10-11 DIAGNOSIS — D649 Anemia, unspecified: Secondary | ICD-10-CM | POA: Insufficient documentation

## 2020-10-11 DIAGNOSIS — F17210 Nicotine dependence, cigarettes, uncomplicated: Secondary | ICD-10-CM | POA: Insufficient documentation

## 2020-10-11 LAB — BASIC METABOLIC PANEL
Anion gap: 8 (ref 5–15)
BUN: 8 mg/dL (ref 6–20)
CO2: 22 mmol/L (ref 22–32)
Calcium: 8.6 mg/dL — ABNORMAL LOW (ref 8.9–10.3)
Chloride: 110 mmol/L (ref 98–111)
Creatinine, Ser: 0.66 mg/dL (ref 0.44–1.00)
GFR, Estimated: >60 mL/min (ref >60)
Glucose, Bld: 95 mg/dL (ref 70–99)
Potassium: 4.3 mmol/L (ref 3.5–5.1)
Sodium: 140 mmol/L (ref 135–145)

## 2020-10-11 LAB — CBC
HCT: 26.8 % — ABNORMAL LOW (ref 36.0–46.0)
Hemoglobin: 7.1 g/dL — ABNORMAL LOW (ref 12.0–15.0)
MCH: 16.7 pg — ABNORMAL LOW (ref 26.0–34.0)
MCHC: 26.5 g/dL — ABNORMAL LOW (ref 30.0–36.0)
MCV: 63.1 fL — ABNORMAL LOW (ref 80.0–100.0)
Platelets: 374 10*3/uL (ref 150–400)
RBC: 4.25 MIL/uL (ref 3.87–5.11)
RDW: 20.1 % — ABNORMAL HIGH (ref 11.5–15.5)
WBC: 5.9 10*3/uL (ref 4.0–10.5)
nRBC: 0 % (ref 0.0–0.2)

## 2020-10-11 LAB — TROPONIN I (HIGH SENSITIVITY)
Troponin I (High Sensitivity): 3 ng/L (ref <18)
Troponin I (High Sensitivity): 4 ng/L (ref <18)

## 2020-10-11 MED ORDER — PREDNISONE 20 MG PO TABS: 40.0000 mg | ORAL_TABLET | Freq: Every day | ORAL | 0 refills | Status: DC

## 2020-10-11 MED ORDER — ALBUTEROL SULFATE HFA 108 (90 BASE) MCG/ACT IN AERS: 2.0000 | INHALATION_SPRAY | Freq: Four times a day (QID) | RESPIRATORY_TRACT | 2 refills | Status: DC | PRN

## 2020-10-11 MED ORDER — METHYLPREDNISOLONE SODIUM SUCC 125 MG IJ SOLR
80.0000 mg | Freq: Once | INTRAMUSCULAR | Status: AC
  Administered 2020-10-11: 80 mg via INTRAVENOUS
  Filled 2020-10-11: qty 2

## 2020-10-11 MED ORDER — IPRATROPIUM-ALBUTEROL 0.5-2.5 (3) MG/3ML IN SOLN
3.0000 mL | Freq: Once | RESPIRATORY_TRACT | Status: AC
  Administered 2020-10-11: 3 mL via RESPIRATORY_TRACT

## 2020-10-11 MED ORDER — IRON 90 (18 FE) MG PO TABS
1.0000 | ORAL_TABLET | ORAL | 0 refills | Status: DC
Start: 2020-10-11 — End: 2020-11-07

## 2020-10-11 MED ORDER — IPRATROPIUM-ALBUTEROL 0.5-2.5 (3) MG/3ML IN SOLN
3.0000 mL | Freq: Once | RESPIRATORY_TRACT | Status: AC
  Administered 2020-10-11: 3 mL via RESPIRATORY_TRACT
  Filled 2020-10-11: qty 6

## 2020-10-11 MED ORDER — IOHEXOL 350 MG/ML SOLN
75.0000 mL | Freq: Once | INTRAVENOUS | Status: AC | PRN
  Administered 2020-10-11: 75 mL via INTRAVENOUS

## 2020-10-11 NOTE — ED Notes (Signed)
Pt presents to ED with c/o of increasing SOB for the past 2-3 weeks. Pt states a HX of COPD and still smokes cigarettes and also admits to smoking crack. Pt does not appear to be acute distress but does have difficulty speaking in complete sentences without the need to take a deep breath. Pt is A&Ox4.

## 2020-10-11 NOTE — ED Provider Notes (Signed)
[facility] Emergency Department Provider Note    First MD Initiated Contact with Patient 10/11/20 1625     (approximate)  I have reviewed the triage vital signs and the nursing notes.   HISTORY  Chief Complaint Shortness of Breath    HPI Denise Alexander is a 49 y.o. female with the below listed past medical history presents to the ER for evaluation of pleuritic chest pain shortness of breath and discomfort lasting the past week.  Not having measured fevers.  Has had some nausea.  Does still smoke.  States that she supposed to be on iron supplement for history of iron deficiency anemia has not been taking the medication.  She admits to being noncompliant with her medications.  Denies any melena or hematochezia.  No vaginal bleeding.  Denies any hemoptysis.  Not any blood thinners.    Past Medical History:  Diagnosis Date  . COPD (chronic obstructive pulmonary disease) ([place])   . Depression   . MI (myocardial infarction) ([place])   . Substance abuse ([place])    No family history on file. Past Surgical History:  Procedure Laterality Date  . LEFT HEART CATH AND CORONARY ANGIOGRAPHY Right 12/03/2017   Procedure: LEFT HEART CATH AND CORONARY ANGIOGRAPHY;  Surgeon: Dionisio David, MD;  Location: [facility];  Service: Cardiovascular;  Laterality: Right;  . TUBAL LIGATION     Patient Active Problem List   Diagnosis Date Noted  . ACS (acute coronary syndrome) ([place]) 12/02/2017  . Lower extremity edema 11/26/2011  . ANEMIA, IRON DEFICIENCY 07/24/2010  . ANEMIA 03/05/2010  . BIPOLAR DISORDER UNSPECIFIED 01/16/2009  . DEPRESSION/ANXIETY 12/17/2008  . TOBACCO ABUSE 12/17/2008  . GASTROESOPHAGEAL REFLUX DISEASE 12/17/2008      Prior to Admission medications   Medication Sig Start Date End Date Taking? Authorizing Provider  albuterol (VENTOLIN HFA) 108 (90 Base) MCG/ACT inhaler Inhale 2 puffs into the lungs every 6 (six) hours as needed for wheezing or  shortness of breath. 10/11/20   Merlyn Lot, MD  aspirin EC 81 MG EC tablet Take 1 tablet (81 mg total) by mouth daily. 12/04/17   Fritzi Mandes, MD  clindamycin (CLEOCIN) 150 MG capsule Take 2 capsules (300 mg total) by mouth 3 (three) times daily. 01/10/19   Fisher, Linden Dolin, PA-C  Ferrous Sulfate (IRON) 90 (18 Fe) MG TABS Take 1 tablet by mouth every other day. 10/11/20   Merlyn Lot, MD  nitroGLYCERIN (NITROSTAT) 0.4 MG SL tablet Place 1 tablet (0.4 mg total) under the tongue every 5 (five) minutes as needed for chest pain. 12/03/17   Fritzi Mandes, MD  predniSONE (DELTASONE) 20 MG tablet Take 2 tablets (40 mg total) by mouth daily for 5 days. 10/11/20 10/16/20  Merlyn Lot, MD  traMADol (ULTRAM) 50 MG tablet Take 1 tablet (50 mg total) by mouth every 6 (six) hours as needed. 01/10/19   Caryn Section Linden Dolin, PA-C    Allergies Penicillins    Social History Social History   Tobacco Use  . Smoking status: Current Every Day Smoker    Packs/day: 0.50    Types: Cigarettes  . Smokeless tobacco: Never Used  . Tobacco comment: Started smoking at age 84.   Substance Use Topics  . Alcohol use: No  . Drug use: Yes    Types: Marijuana, Cocaine    Comment: last use x 4 days     Review of Systems Patient denies headaches, rhinorrhea, blurry vision, numbness, shortness of breath, chest pain,  edema, cough, abdominal pain, nausea, vomiting, diarrhea, dysuria, fevers, rashes or hallucinations unless otherwise stated above in HPI. ____________________________________________   PHYSICAL EXAM:  VITAL SIGNS: Vitals:   10/11/20 1119 10/11/20 1500  BP: 98/65 (!) 100/55  Pulse: 91 (!) 105  Resp: 18 20  Temp: 98.2 F (36.8 C)   SpO2: 100% 100%    Constitutional: Alert and oriented.  Eyes: Conjunctivae are normal.  Head: Atraumatic. Nose: No congestion/rhinnorhea. Mouth/Throat: Mucous membranes are moist.   Neck: No stridor. Painless ROM.  Cardiovascular: Normal rate, regular  rhythm. Grossly normal heart sounds.  Good peripheral circulation. Respiratory: Normal respiratory effort.  Coarse bibasilar breathsounds Gastrointestinal: Soft and nontender. No distention. No abdominal bruits. No CVA tenderness. Genitourinary:  Musculoskeletal: No lower extremity tenderness nor edema.  No joint effusions. Neurologic:  Normal speech and language. No gross focal neurologic deficits are appreciated. No facial droop Skin:  Skin is warm, dry and intact. No rash noted. Psychiatric: Mood and affect are normal. Speech and behavior are normal.  ____________________________________________   LABS (all labs ordered are listed, but only abnormal results are displayed)  Results for orders placed or performed during the hospital encounter of 10/11/20 (from the past 24 hour(s))  Basic metabolic panel     Status: Abnormal   Collection Time: 10/11/20 11:22 AM  Result Value Ref Range   Sodium 140 135 - 145 mmol/L   Potassium 4.3 3.5 - 5.1 mmol/L   Chloride 110 98 - 111 mmol/L   CO2 22 22 - 32 mmol/L   Glucose, Bld 95 70 - 99 mg/dL   BUN 8 6 - 20 mg/dL   Creatinine, Ser 0.66 0.44 - 1.00 mg/dL   Calcium 8.6 (L) 8.9 - 10.3 mg/dL   GFR, Estimated >60 >60 mL/min   Anion gap 8 5 - 15  CBC     Status: Abnormal   Collection Time: 10/11/20 11:22 AM  Result Value Ref Range   WBC 5.9 4.0 - 10.5 K/uL   RBC 4.25 3.87 - 5.11 MIL/uL   Hemoglobin 7.1 (L) 12.0 - 15.0 g/dL   HCT 26.8 (L) 36 - 46 %   MCV 63.1 (L) 80.0 - 100.0 fL   MCH 16.7 (L) 26.0 - 34.0 pg   MCHC 26.5 (L) 30.0 - 36.0 g/dL   RDW 20.1 (H) 11.5 - 15.5 %   Platelets 374 150 - 400 K/uL   nRBC 0.0 0.0 - 0.2 %  Troponin I (High Sensitivity)     Status: None   Collection Time: 10/11/20 11:22 AM  Result Value Ref Range   Troponin I (High Sensitivity) 3 <18 ng/L  Troponin I (High Sensitivity)     Status: None   Collection Time: 10/11/20  2:59 PM  Result Value Ref Range   Troponin I (High Sensitivity) 4 <18 ng/L    ____________________________________________  EKG My review and personal interpretation at Time: 11:19   Indication: sob  Rate: 90  Rhythm: sinus Axis: normal Other: short pr, normal qt, no stemi ____________________________________________  RADIOLOGY  I personally reviewed all radiographic images ordered to evaluate for the above acute complaints and reviewed radiology reports and findings.  These findings were personally discussed with the patient.  Please see medical record for radiology report.  ____________________________________________   PROCEDURES  Procedure(s) performed:  Procedures    Critical Care performed: no ____________________________________________   INITIAL IMPRESSION / ASSESSMENT AND PLAN / ED COURSE  Pertinent labs & imaging results that were available during my care of the  patient were reviewed by me and considered in my medical decision making (see chart for details).   DDX: ACS, pericarditis, esophagitis, boerhaaves, pe, dissection, pna, bronchitis, costochondritis   Denise Alexander is a 49 y.o. who presents to the ED with presentation as described above.  Patient nontoxic-appearing does have significant wheezing on exam suspect COPD.  Chest pain described as atypical does have some pleuritic chest pain.  Given her age and risk factors will order CTA to further evaluate.  Does not seem consistent with dissection.  Patient probably did make comments to nursing staff about recent drug use which certainly could be causing some of her symptoms.   Clinical Course as of Oct 11 2029  Fri Oct 11, 2020  1810 CTA consistent with emphysema no sign of PE or infiltrate.  No sign of CHF.  EKG is nonischemic troponins are negative.  Does have a chronic anemia she may be symptomatic from this but has had hemoglobins in the sevens and eights for many years.  States that she is been noncompliant with her iron.  I am going to prescribe additional iron supplementation  discussed the importance of using nebulizers and compliance with the medication.  I suspect most of her shortness of breath is secondary to COPD.   [PR]    Clinical Course User Index [PR] Merlyn Lot, MD    The patient was evaluated in Emergency Department today for the symptoms described in the history of present illness. He/she was evaluated in the context of the global COVID-19 pandemic, which necessitated consideration that the patient might be at risk for infection with the SARS-CoV-2 virus that causes COVID-19. Institutional protocols and algorithms that pertain to the evaluation of patients at risk for COVID-19 are in a state of rapid change based on information released by regulatory bodies including the CDC and federal and state organizations. These policies and algorithms were followed during the patient's care in the ED.  As part of my medical decision making, I reviewed the following data within the [facility] notes reviewed and incorporated, Labs reviewed, notes from prior ED visits and [place] Controlled Substance Database   ____________________________________________   FINAL CLINICAL IMPRESSION(S) / ED DIAGNOSES  Final diagnoses:  COPD exacerbation ([place])  Anemia, unspecified type      NEW MEDICATIONS STARTED DURING THIS VISIT:  Discharge Medication List as of 10/11/2020  6:21 PM    START taking these medications   Details  albuterol (VENTOLIN HFA) 108 (90 Base) MCG/ACT inhaler Inhale 2 puffs into the lungs every 6 (six) hours as needed for wheezing or shortness of breath., Starting Fri 10/11/2020, Normal    Ferrous Sulfate (IRON) 90 (18 Fe) MG TABS Take 1 tablet by mouth every other day., Starting Fri 10/11/2020, Normal    predniSONE (DELTASONE) 20 MG tablet Take 2 tablets (40 mg total) by mouth daily for 5 days., Starting Fri 10/11/2020, Until Wed 10/16/2020, Normal         Note:  This document was prepared using Dragon voice  recognition software and may include unintentional dictation errors.    Merlyn Lot, MD 10/11/20 2030

## 2020-10-11 NOTE — ED Triage Notes (Signed)
Reports SOB X 2-3 weeks with hx of COPD. Emesis that started last night. Pt does not report CP but when asked by RN if any chest pain, says yes. Pt alert and oriented X4, cooperative, RR even and unlabored, color WNL. Pt in NAD.

## 2020-10-11 NOTE — ED Notes (Signed)
D/C and f/up and to stop drug use discussed with pt, pt verbalized understanding. NO distress noted. GB'M discussed with pt.

## 2020-10-11 NOTE — ED Triage Notes (Signed)
Pt [place] into the ED [place] [facility] [place], pt c/o N/V/D diarrhea, cough with SOB and hx of COPD, sx for the past 2 weeks. VSS. Pt is a/ox4 on arrival in NAD at present.

## 2020-10-15 ENCOUNTER — Other Ambulatory Visit: Payer: Self-pay | Admitting: Student in an Organized Health Care Education/Training Program

## 2020-10-17 ENCOUNTER — Other Ambulatory Visit: Payer: Self-pay | Admitting: Gerontology

## 2020-10-17 ENCOUNTER — Other Ambulatory Visit: Payer: Self-pay

## 2020-10-17 ENCOUNTER — Ambulatory Visit: Payer: Self-pay | Admitting: Gerontology

## 2020-10-17 VITALS — BP 130/84 | HR 83 | Temp 97.2°F | Resp 17 | Wt 107.1 lb

## 2020-10-17 DIAGNOSIS — J449 Chronic obstructive pulmonary disease, unspecified: Secondary | ICD-10-CM | POA: Insufficient documentation

## 2020-10-17 DIAGNOSIS — F17200 Nicotine dependence, unspecified, uncomplicated: Secondary | ICD-10-CM

## 2020-10-17 DIAGNOSIS — I252 Old myocardial infarction: Secondary | ICD-10-CM

## 2020-10-17 DIAGNOSIS — K219 Gastro-esophageal reflux disease without esophagitis: Secondary | ICD-10-CM

## 2020-10-17 DIAGNOSIS — Z7689 Persons encountering health services in other specified circumstances: Secondary | ICD-10-CM | POA: Insufficient documentation

## 2020-10-17 DIAGNOSIS — F341 Dysthymic disorder: Secondary | ICD-10-CM

## 2020-10-17 DIAGNOSIS — J439 Emphysema, unspecified: Secondary | ICD-10-CM

## 2020-10-17 DIAGNOSIS — D509 Iron deficiency anemia, unspecified: Secondary | ICD-10-CM

## 2020-10-17 MED ORDER — NITROGLYCERIN 0.4 MG SL SUBL: 0.4000 mg | SUBLINGUAL_TABLET | SUBLINGUAL | 12 refills | Status: DC | PRN

## 2020-10-17 MED ORDER — FLUTICASONE-SALMETEROL 100-50 MCG/DOSE IN AEPB: 1.0000 | INHALATION_SPRAY | Freq: Two times a day (BID) | RESPIRATORY_TRACT | 2 refills | Status: DC

## 2020-10-17 MED ORDER — OMEPRAZOLE 20 MG PO CPDR: 20.0000 mg | DELAYED_RELEASE_CAPSULE | Freq: Every day | ORAL | 0 refills | Status: DC

## 2020-10-17 NOTE — Progress Notes (Signed)
Patient ID: Denise Alexander, female   DOB: 24-Feb-1971, 49 y.o.   MRN: [id number]  Chief Complaint  Patient presents with  . Establish Care  . Gastroesophageal Reflux    HPI Denise Alexander is a 49 y.o. female.  She presents to the clinic today to establish care as a new patient and review her medical problems. She resides at [facility] and states that she wants to get back on all her medications and take care of her health after being off of them for more than a year. She has a history of COPD, GERD, depression, substance abuse, anemia, and an MI for which she had a left heart cath in December 2018. She was most recently seen in the ED on 10/11/20 for a COPD exacerbation. A CT Angio was performed and the results were consistent with emphysema with no sign of PE, infiltrate or CHF. Her EKG was nonischemic and she was started on prednisone. Today, she reports that her breathing is improving. States that she is using her albuterol inhaler twice daily and has 3 more days of prednisone left. She is a current every day smoker and smokes half a pack daily. She reports that she does not use alcohol or other drugs at this time. She reports that she has been using OTC Prilosec PRN for her GERD symptoms with some relief. Reports compliance with her iron supplements for her anemia and denies weakness, constipation, or syncope. She states that her mood is depressed and she is interested in speaking with our mental health provider. She denies suicidal or homicidal ideation. Overall, she states that she is doing well and offers no further complaint.    Past Medical History:  Diagnosis Date  . COPD (chronic obstructive pulmonary disease) ([place])   . Depression   . MI (myocardial infarction) ([place])   . Substance abuse [facility])     Past Surgical History:  Procedure Laterality Date  . LEFT HEART CATH AND CORONARY ANGIOGRAPHY Right 12/03/2017   Procedure: LEFT HEART CATH AND CORONARY ANGIOGRAPHY;  Surgeon: Dionisio David, MD;  Location: [facility];  Service: Cardiovascular;  Laterality: Right;  . TUBAL LIGATION      No family history on file.  Social History Social History   Tobacco Use  . Smoking status: Current Every Day Smoker    Packs/day: 0.50    Types: Cigarettes  . Smokeless tobacco: Never Used  . Tobacco comment: Started smoking at age 89.   Substance Use Topics  . Alcohol use: No  . Drug use: Yes    Types: Marijuana, Cocaine    Comment: last use x 4 days     Allergies  Allergen Reactions  . Penicillins Swelling    Has patient had a PCN reaction causing immediate rash, facial/tongue/throat swelling, SOB or lightheadedness with hypotension: Yes Has patient had a PCN reaction causing severe rash involving mucus membranes or skin necrosis: Unknown Has patient had a PCN reaction that required hospitalization: No Has patient had a PCN reaction occurring within the last 10 years: No If all of the above answers are "NO", then may proceed with Cephalosporin use.    Current Outpatient Medications  Medication Sig Dispense Refill  . albuterol (VENTOLIN HFA) 108 (90 Base) MCG/ACT inhaler Inhale 2 puffs into the lungs every 6 (six) hours as needed for wheezing or shortness of breath. 8 g 2  . Ferrous Sulfate (IRON) 90 (18 Fe) MG TABS Take 1 tablet by mouth every other day.  30 tablet 0  . aspirin EC 81 MG EC tablet Take 1 tablet (81 mg total) by mouth daily. (Patient not taking: Reported on 10/17/2020) 30 tablet 0  . Fluticasone-Salmeterol (ADVAIR) 100-50 MCG/DOSE AEPB Inhale 1 puff into the lungs in the morning and at bedtime. 60 each 2  . nitroGLYCERIN (NITROSTAT) 0.4 MG SL tablet Place 1 tablet (0.4 mg total) under the tongue every 5 (five) minutes as needed for chest pain. 30 tablet 12  . omeprazole (PRILOSEC) 20 MG capsule Take 1 capsule (20 mg total) by mouth daily. 30 capsule 0   No current facility-administered medications for this visit.    Review of Systems Review of  Systems  Constitutional: Positive for fatigue.       Improving with iron supplements per patient  HENT: Positive for rhinorrhea.   Eyes: Positive for visual disturbance.       States that she has been squinting at the television at night  Respiratory: Positive for shortness of breath.        Improving per patient  Cardiovascular: Negative.   Gastrointestinal: Negative.   Endocrine: Positive for heat intolerance.       States that she has "hot flashes"  Genitourinary: Negative.   Musculoskeletal: Negative.   Skin: Negative.   Neurological: Negative.   Hematological: Bruises/bleeds easily.  Psychiatric/Behavioral: Positive for dysphoric mood.    Blood pressure 130/84, pulse 83, temperature (!) 97.2 F (36.2 C), resp. rate 17, weight 107 lb 1.6 oz (48.6 kg), last menstrual period 11/28/2017, SpO2 98 %.  Physical Exam Physical Exam Constitutional:      Appearance: Normal appearance.  HENT:     Head: Normocephalic.     Nose: Nose normal.  Eyes:     Pupils: Pupils are equal, round, and reactive to light.  Cardiovascular:     Rate and Rhythm: Normal rate and regular rhythm.     Pulses: Normal pulses.     Heart sounds: Normal heart sounds.  Pulmonary:     Breath sounds: Rhonchi present.     Comments: Improves with cough Abdominal:     General: Abdomen is flat. Bowel sounds are normal.     Palpations: Abdomen is soft.  Musculoskeletal:        General: Normal range of motion.     Cervical back: Normal range of motion.  Skin:    General: Skin is warm and dry.     Capillary Refill: Capillary refill takes less than 2 seconds.  Neurological:     General: No focal deficit present.     Mental Status: She is alert and oriented to person, place, and time.  Psychiatric:        Behavior: Behavior normal.        Thought Content: Thought content normal.        Judgment: Judgment normal.    Data Reviewed Previous chart data reviewed.  Assessment & Plan 1. Pulmonary emphysema,  unspecified emphysema type ([place]) Start Advair as prescribed. Educated patient to rinse mouth out well after use. Educated on potential side effects, such as, thrush, hoarse voice, increased blood pressure, and allergic reaction. Advised to all the clinic or seek emergency care with any concerns.   - Fluticasone-Salmeterol (ADVAIR) 100-50 MCG/DOSE AEPB; Inhale 1 puff into the lungs in the morning and at bedtime.  Dispense: 60 each; Refill: 2  2. Encounter to establish care Return for baseline labs on 10/30/20. [facility] flyer given to patient to schedule pap smear. - Comp Met (CMET); Future -  TSH; Future - HgB A1c; Future - Lipid Profile; Future - CBC w/Diff; Future - Ambulatory referral to Hematology / Oncology  3. TOBACCO ABUSE Patient encouraged on smoking cessation. She states that she is not ready. Advised to cut down as she can.  4. Iron deficiency anemia, unspecified iron deficiency anemia type Continue iron supplementation as prescribed. Educated to increase lean meats and dark, leafy greens in diet. - Iron Binding Cap (TIBC)([facility]); Future  5. DEPRESSION/ANXIETY Appointment with Jerrilyn Cairo scheduled. Educated to seek emergency care for any worsening symptoms or thoughts of suicide.  6. Gastroesophageal reflux disease, unspecified whether esophagitis present Start omeprazole as prescribed. Patient educated on potential side effects, such as, headache, dizziness and diarrhea. Instructed to contact the clinic with any troublesome symptoms.  -Also educated to: --Avoid spicy, fatty and fried food --Avoid sodas and sour juices --Avoid heavy meals --Avoid eating 4 hours before bedtime --Elevate head of bed at night --Report to the Emergency Department with chest pain, shortness of breath, hematemesis, or severe pain.  - omeprazole (PRILOSEC) 20 MG capsule; Take 1 capsule (20 mg total) by mouth daily.  Dispense: 30 capsule; Refill: 0  7. History of MI (myocardial  infarction) Resume aspirin 87m daily. Resume nitroglycerin PRN for chest pain. Referral provided for cardiology. - nitroGLYCERIN (NITROSTAT) 0.4 MG SL tablet; Place 1 tablet (0.4 mg total) under the tongue every 5 (five) minutes as needed for chest pain.  Dispense: 30 tablet; Refill: 12 - Ambulatory referral to Cardiology   Follow-up: Return in about 3 weeks (around 11/07/2020), or if symptoms worsen or fail to improve.   JMarina Gravel Student-NP

## 2020-10-29 ENCOUNTER — Ambulatory Visit: Payer: Self-pay | Admitting: Pharmacy Technician

## 2020-10-29 DIAGNOSIS — Z79899 Other long term (current) drug therapy: Secondary | ICD-10-CM

## 2020-10-30 ENCOUNTER — Other Ambulatory Visit: Payer: Self-pay

## 2020-10-30 DIAGNOSIS — D509 Iron deficiency anemia, unspecified: Secondary | ICD-10-CM

## 2020-10-30 DIAGNOSIS — Z7689 Persons encountering health services in other specified circumstances: Secondary | ICD-10-CM

## 2020-10-30 NOTE — Progress Notes (Signed)
Completed [facility] application and contract.  Patient agreed to all terms of the [facility] contract.    Patient approved to receive medication assistance at [facility] until time for re-certification in 4401, and as long as eligibility criteria continues to be met.    Provided patient with community resource material based on her particular needs.    [facility]

## 2020-10-31 ENCOUNTER — Telehealth: Payer: Self-pay | Admitting: Pharmacist

## 2020-10-31 LAB — COMPREHENSIVE METABOLIC PANEL
ALT: 66 IU/L — ABNORMAL HIGH (ref 0–32)
AST: 52 IU/L — ABNORMAL HIGH (ref 0–40)
Albumin/Globulin Ratio: 1.5 (ref 1.2–2.2)
Albumin: 3.5 g/dL — ABNORMAL LOW (ref 3.8–4.8)
Alkaline Phosphatase: 101 IU/L (ref 44–121)
BUN/Creatinine Ratio: 10 (ref 9–23)
BUN: 7 mg/dL (ref 6–24)
Bilirubin Total: <0.2 mg/dL (ref 0.0–1.2)
CO2: 21 mmol/L (ref 20–29)
Calcium: 8.7 mg/dL (ref 8.7–10.2)
Chloride: 109 mmol/L — ABNORMAL HIGH (ref 96–106)
Creatinine, Ser: 0.69 mg/dL (ref 0.57–1.00)
GFR calc Af Amer: 119 mL/min/{1.73_m2} (ref >59)
GFR calc non Af Amer: 103 mL/min/{1.73_m2} (ref >59)
Globulin, Total: 2.4 g/dL (ref 1.5–4.5)
Glucose: 94 mg/dL (ref 65–99)
Potassium: 4 mmol/L (ref 3.5–5.2)
Sodium: 143 mmol/L (ref 134–144)
Total Protein: 5.9 g/dL — ABNORMAL LOW (ref 6.0–8.5)

## 2020-10-31 LAB — CBC WITH DIFFERENTIAL/PLATELET
Basophils Absolute: 0 10*3/uL (ref 0.0–0.2)
Basos: 0 %
EOS (ABSOLUTE): 0.1 10*3/uL (ref 0.0–0.4)
Eos: 2 %
Hematocrit: 27.8 % — ABNORMAL LOW (ref 34.0–46.6)
Hemoglobin: 7.5 g/dL — ABNORMAL LOW (ref 11.1–15.9)
Immature Grans (Abs): 0 10*3/uL (ref 0.0–0.1)
Immature Granulocytes: 0 %
Lymphocytes Absolute: 2.2 10*3/uL (ref 0.7–3.1)
Lymphs: 35 %
MCH: 17.1 pg — ABNORMAL LOW (ref 26.6–33.0)
MCHC: 27 g/dL — ABNORMAL LOW (ref 31.5–35.7)
MCV: 64 fL — ABNORMAL LOW (ref 79–97)
Monocytes Absolute: 0.5 10*3/uL (ref 0.1–0.9)
Monocytes: 8 %
Neutrophils Absolute: 3.4 10*3/uL (ref 1.4–7.0)
Neutrophils: 55 %
Platelets: 330 10*3/uL (ref 150–450)
RBC: 4.38 x10E6/uL (ref 3.77–5.28)
RDW: 21 % — ABNORMAL HIGH (ref 11.7–15.4)
WBC: 6.2 10*3/uL (ref 3.4–10.8)

## 2020-10-31 LAB — IRON AND TIBC
Iron Saturation: 4 % — CL (ref 15–55)
Iron: 14 ug/dL — ABNORMAL LOW (ref 27–159)
Total Iron Binding Capacity: 397 ug/dL (ref 250–450)
UIBC: 383 ug/dL (ref 131–425)

## 2020-10-31 LAB — LIPID PANEL
Chol/HDL Ratio: 5.3 ratio — ABNORMAL HIGH (ref 0.0–4.4)
Cholesterol, Total: 133 mg/dL (ref 100–199)
HDL: 25 mg/dL — ABNORMAL LOW (ref >39)
LDL Chol Calc (NIH): 93 mg/dL (ref 0–99)
Triglycerides: 75 mg/dL (ref 0–149)
VLDL Cholesterol Cal: 15 mg/dL (ref 5–40)

## 2020-10-31 LAB — HEMOGLOBIN A1C
Est. average glucose Bld gHb Est-mCnc: 117 mg/dL
Hgb A1c MFr Bld: 5.7 % — ABNORMAL HIGH (ref 4.8–5.6)

## 2020-10-31 LAB — TSH: TSH: 4.43 u[IU]/mL (ref 0.450–4.500)

## 2020-10-31 NOTE — Telephone Encounter (Signed)
10/31/2020 10:33:05 AM - Advair & ProAir forms to pt & provider  -- Elmer Picker - Thursday, October 31, 2020 10:29 AM --Received a call from Don Perking @ [place], she had patient on speaker phone checking on the status of Advair inhaler. I had to review QS1 & CHL, Inez Catalina had just talked with patient on Tuesday. I talked with Velva Harman and Walid-tracked down pharmacy printout for Advair 100/50 Inhale 1 puff into the lungs every morning & at bedtime, also patient on Albuterol-printout for ProAir HFA Inhale 2 puffs into the lungs every 6 hours as needed for wheezing or shortness of breath. I have put provider portion in [facility] folder, and patient portion in basket with samples of Advair for patient to sign when she picks up today.

## 2020-11-05 ENCOUNTER — Other Ambulatory Visit: Payer: Self-pay | Admitting: Gerontology

## 2020-11-05 ENCOUNTER — Other Ambulatory Visit: Payer: Self-pay

## 2020-11-05 ENCOUNTER — Ambulatory Visit (INDEPENDENT_AMBULATORY_CARE_PROVIDER_SITE_OTHER): Payer: Self-pay | Admitting: Cardiology

## 2020-11-05 ENCOUNTER — Encounter: Payer: Self-pay | Admitting: Cardiology

## 2020-11-05 VITALS — BP 118/74 | HR 82 | Ht 62.0 in | Wt 111.0 lb

## 2020-11-05 DIAGNOSIS — I2510 Atherosclerotic heart disease of native coronary artery without angina pectoris: Secondary | ICD-10-CM

## 2020-11-05 DIAGNOSIS — E785 Hyperlipidemia, unspecified: Secondary | ICD-10-CM

## 2020-11-05 DIAGNOSIS — F1910 Other psychoactive substance abuse, uncomplicated: Secondary | ICD-10-CM

## 2020-11-05 MED ORDER — ATORVASTATIN CALCIUM 40 MG PO TABS: 40.0000 mg | ORAL_TABLET | Freq: Every day | ORAL | 5 refills | Status: DC

## 2020-11-05 MED ORDER — ISOSORBIDE MONONITRATE ER 30 MG PO TB24: 15.0000 mg | ORAL_TABLET | Freq: Every day | ORAL | 5 refills | Status: DC

## 2020-11-05 NOTE — Patient Instructions (Signed)
Medication Instructions:   Your physician has recommended you make the following change in your medication:   1.  START taking isosorbide mononitrate (IMDUR) 30 MG 24 hr tablet: Take 0.5 tablets (15 mg total) by mouth daily. 2.  START taking atorvastatin (LIPITOR) 40 MG tablet:  Take 1 tablet (40 mg total) by mouth daily.   Lab Work: None Ordered If you have labs (blood work) drawn today and your tests are completely normal, you will receive your results only by:  [place] (if you have MyChart) OR  A paper copy in the mail If you have any lab test that is abnormal or we need to change your treatment, we will call you to review the results.   Testing/Procedures:  1. Your physician has requested that you have an echocardiogram. Echocardiography is a painless test that uses sound waves to create images of your heart. It provides your doctor with information about the size and shape of your heart and how well your hearts chambers and valves are working. This procedure takes approximately one hour. There are no restrictions for this procedure.     Follow-Up: At [facility], you and your health needs are our priority.  As part of our continuing mission to provide you with exceptional heart care, we have created designated Provider Care Teams.  These Care Teams include your primary Cardiologist (physician) and Advanced Practice Providers (APPs -  Physician Assistants and Nurse Practitioners) who all work together to provide you with the care you need, when you need it.  We recommend signing up for the patient portal called "MyChart".  Sign up information is provided on this After Visit Summary.  MyChart is used to connect with patients for Virtual Visits (Telemedicine).  Patients are able to view lab/test results, encounter notes, upcoming appointments, etc.  Non-urgent messages can be sent to your provider as well.   To learn more about what you can do with MyChart, go to  [email or web address].    Your next appointment:   Follow up after Echo   The format for your next appointment:   In Person  Provider:   Kate Sable, MD   Other Instructions

## 2020-11-05 NOTE — Progress Notes (Signed)
Cardiology Office Note:    Date:  11/05/2020   ID:  Denise Alexander, DOB Jul 20, 1971, MRN [id number]  PCP:  Patient, No Pcp Per  [place] Cardiologist:  Kate Sable, MD  [place] Electrophysiologist:  None   Referring MD: Langston Reusing, NP   Chief Complaint  Patient presents with  . New Patient (Initial Visit)    Establish care with provider for history of heart attack and has not been seen by a cardiologist in awhile. Medications verbally reviewed with patient.     History of Present Illness:    Denise Alexander is a 49 y.o. female with a hx of non obstructive NSTEMI/CAD ([place] 2018 -50% ost 1st diagonal), COPD, cocaine use, current smoker x 30+yrs who presents to establish care due to history of NSTEMI.  Patient was evaluated in the ED in 2018 due to chest pains and elevated troponins, diagnosed with NSTEMI.  This occurred in the context of cocaine use. Left heart cath 2018 showed 50% stenosis of first diagonal (<56m vessel), rest of coronaries were normal.  Echocardiogram 11/2017 showed normal ejection fraction, EF 60%.  Mild MR.  Her symptoms were deemed likely from coronary vasospasm.  Imdur was recommended upon discharge.  Patient has not followed up with cardiology since.  She still smokes and uses cocaine.  Denies any chest pain but endorses shortness of breath which she attributes to COPD.   Past Medical History:  Diagnosis Date  . [place] disease) ([place]   . Depression   . MI [place]ocardial infarction) ([place]   . Substance abuse ([facility]     Past Surgical History:  Procedure Laterality Date  . LEFT HEART CATH AND CORONARY ANGIOGRAPHY Right 12/03/2017   Procedure: LEFT HEART CATH AND CORONARY ANGIOGRAPHY;  Surgeon: KDionisio David MD;  [facility];  Service: Cardiovascular;  Laterality: Right;  . TUBAL LIGATION      Current Medications: Current Meds  Medication Sig  . albuterol (VENTOLIN HFA) 108 (90 Base)  MCG/ACT inhaler Inhale 2 puffs into the lungs every 6 (six) hours as needed for wheezing or shortness of breath.  .Marland Kitchenaspirin EC 81 MG EC tablet Take 1 tablet (81 mg total) by mouth daily.  . Ferrous Sulfate (IRON) 90 (18 Fe) MG TABS Take 1 tablet by mouth every other day.  . Fluticasone-Salmeterol (ADVAIR) 100-50 MCG/DOSE AEPB Inhale 1 puff into the lungs in the morning and at bedtime.  . nitroGLYCERIN (NITROSTAT) 0.4 MG SL tablet Place 1 tablet (0.4 mg total) under the tongue every 5 (five) minutes as needed for chest pain.  .Marland Kitchenomeprazole (PRILOSEC) 20 MG capsule Take 1 capsule (20 mg total) by mouth daily.     Allergies:   Penicillins   Social History   Socioeconomic History  . Marital status: Single    Spouse name: Not on file  . Number of children: Not on file  . Years of education: Not on file  . Highest education level: Not on file  Occupational History  . Not on file  Tobacco Use  . Smoking status: Current Every Day Smoker    Packs/day: 0.50    Types: Cigarettes  . Smokeless tobacco: Never Used  . Tobacco comment: Started smoking at age [age]   Substance and Sexual Activity  . Alcohol use: No  . Drug use: Yes    Types: Marijuana, Cocaine    Comment: last use x 4 days   . Sexual activity: Not Currently  Other Topics Concern  . Not on file  Social History Narrative  . Not on file   Social Determinants of Health   Financial Resource Strain:   . Difficulty of Paying Living Expenses: Not on file  Food Insecurity:   . Worried About Charity fundraiser in the Last Year: Not on file  . Ran Out of Food in the Last Year: Not on file  Transportation Needs:   . Lack of Transportation (Medical): Not on file  . Lack of Transportation (Non-Medical): Not on file  Physical Activity:   . Days of Exercise per Week: Not on file  . Minutes of Exercise per Session: Not on file  Stress:   . Feeling of Stress : Not on file  Social Connections:   . Frequency of Communication with  Friends and Family: Not on file  . Frequency of Social Gatherings with Friends and Family: Not on file  . Attends Religious Services: Not on file  . Active Member of Clubs or Organizations: Not on file  . Attends Archivist Meetings: Not on file  . Marital Status: Not on file     Family History: The patient's family history is not on file.  ROS:   Please see the history of present illness.     All other systems reviewed and are negative.  EKGs/Labs/Other Studies Reviewed:    The following studies were reviewed today:   EKG:  EKG is  ordered today.  The ekg ordered today demonstrates sinus rhythm, normal ECG.  Recent Labs: 10/30/2020: ALT 66; BUN 7; Creatinine, Ser 0.69; Hemoglobin 7.5; Platelets 330; Potassium 4.0; Sodium 143; TSH 4.430  Recent Lipid Panel    Component Value Date/Time   CHOL 133 10/30/2020 1007   TRIG 75 10/30/2020 1007   HDL 25 (L) 10/30/2020 1007   CHOLHDL 5.3 (H) 10/30/2020 1007   CHOLHDL 5.5 12/03/2017 0414   VLDL 18 12/03/2017 0414   LDLCALC 93 10/30/2020 1007     Risk Assessment/Calculations:      Physical Exam:    VS:  BP 118/74 (BP Location: Right Arm, Patient Position: Sitting, Cuff Size: Normal)   Pulse 82   Ht 5' 2"  (1.575 m)   Wt 111 lb (50.3 kg)   LMP 11/28/2017 (LMP Unknown)   SpO2 97%   BMI 20.30 kg/m     Wt Readings from Last 3 Encounters:  11/05/20 111 lb (50.3 kg)  10/17/20 107 lb 1.6 oz (48.6 kg)  10/11/20 104 lb (47.2 kg)     GEN:  Well nourished, well developed in no acute distress HEENT: Normal NECK: No JVD; No carotid bruits LYMPHATICS: No lymphadenopathy CARDIAC: RRR, no murmurs, rubs, gallops RESPIRATORY:  Clear to auscultation without rales, wheezing or rhonchi  ABDOMEN: Soft, non-tender, non-distended MUSCULOSKELETAL:  No edema; No deformity  SKIN: Warm and dry NEUROLOGIC:  Alert and oriented x 3 PSYCHIATRIC:  Normal affect   ASSESSMENT:    1. Coronary artery disease involving native coronary  artery of native heart without angina pectoris   2. Hyperlipidemia LDL goal <70   3. Polysubstance abuse ([place])      PLAN:    In order of problems listed above:  1. History of nonobstructive CAD, 50% first diagonal stenosis.  Continue aspirin 81 mg daily.  Echo 2018 with normal EF.  Repeat echocardiogram especially in light of ongoing cocaine use.  Prior NSTEMI likely secondary to vasospasm from cocaine and smoking.  Start Imdur 15 mg daily.  Start Lipitor  40 mg daily. 2. Nonobstructive CAD, LDL goal less than 70, start Lipitor 40 mg daily. 3. Patient is a current smoker, still uses cocaine, cessation advised.  Over 5 minutes spent counseling patient.    Follow-up after echocardiogram.  Shared Decision Making/Informed Consent       Medication Adjustments/Labs and Tests Ordered: Current medicines are reviewed at length with the patient today.  Concerns regarding medicines are outlined above.  Orders Placed This Encounter  Procedures  . EKG 12-Lead  . ECHOCARDIOGRAM COMPLETE   Meds ordered this encounter  Medications  . isosorbide mononitrate (IMDUR) 30 MG 24 hr tablet    Sig: Take 0.5 tablets (15 mg total) by mouth daily.    Dispense:  30 tablet    Refill:  5  . atorvastatin (LIPITOR) 40 MG tablet    Sig: Take 1 tablet (40 mg total) by mouth daily.    Dispense:  30 tablet    Refill:  5    Patient Instructions  Medication Instructions:   Your physician has recommended you make the following change in your medication:   1.  START taking isosorbide mononitrate (IMDUR) 30 MG 24 hr tablet: Take 0.5 tablets (15 mg total) by mouth daily. 2.  START taking atorvastatin (LIPITOR) 40 MG tablet:  Take 1 tablet (40 mg total) by mouth daily.   Lab Work: None Ordered If you have labs (blood work) drawn today and your tests are completely normal, you will receive your results only by: Marland Kitchen MyChart Message (if you have MyChart) OR . A paper copy in the mail If you have any lab test that  is abnormal or we need to change your treatment, we will call you to review the results.   Testing/Procedures:  1. Your physician has requested that you have an echocardiogram. Echocardiography is a painless test that uses sound waves to create images of your heart. It provides your doctor with information about the size and shape of your heart and how well your heart's chambers and valves are working. This procedure takes approximately one hour. There are no restrictions for this procedure.     Follow-Up: At [facility], you and your health needs are our priority.  As part of our continuing mission to provide you with exceptional heart care, we have created designated Provider Care Teams.  These Care Teams include your primary Cardiologist (physician) and Advanced Practice Providers (APPs -  Physician Assistants and Nurse Practitioners) who all work together to provide you with the care you need, when you need it.  We recommend signing up for the patient portal called "MyChart".  Sign up information is provided on this After Visit Summary.  MyChart is used to connect with patients for Virtual Visits (Telemedicine).  Patients are able to view lab/test results, encounter notes, upcoming appointments, etc.  Non-urgent messages can be sent to your provider as well.   To learn more about what you can do with MyChart, go to [email or web address].    Your next appointment:   Follow up after Echo   The format for your next appointment:   In Person  Provider:   Kate Sable, MD   Other Instructions      Signed, Kate Sable, MD  11/05/2020 10:15 AM    [place]

## 2020-11-07 ENCOUNTER — Encounter: Payer: Self-pay | Admitting: Gerontology

## 2020-11-07 ENCOUNTER — Other Ambulatory Visit: Payer: Self-pay | Admitting: Gerontology

## 2020-11-07 ENCOUNTER — Ambulatory Visit: Payer: Self-pay | Admitting: Gerontology

## 2020-11-07 ENCOUNTER — Other Ambulatory Visit: Payer: Self-pay

## 2020-11-07 VITALS — BP 103/67 | HR 98 | Wt 107.4 lb

## 2020-11-07 DIAGNOSIS — I2510 Atherosclerotic heart disease of native coronary artery without angina pectoris: Secondary | ICD-10-CM | POA: Insufficient documentation

## 2020-11-07 DIAGNOSIS — K219 Gastro-esophageal reflux disease without esophagitis: Secondary | ICD-10-CM

## 2020-11-07 DIAGNOSIS — R748 Abnormal levels of other serum enzymes: Secondary | ICD-10-CM | POA: Insufficient documentation

## 2020-11-07 DIAGNOSIS — R7303 Prediabetes: Secondary | ICD-10-CM | POA: Insufficient documentation

## 2020-11-07 DIAGNOSIS — D509 Iron deficiency anemia, unspecified: Secondary | ICD-10-CM

## 2020-11-07 DIAGNOSIS — F341 Dysthymic disorder: Secondary | ICD-10-CM

## 2020-11-07 DIAGNOSIS — F1910 Other psychoactive substance abuse, uncomplicated: Secondary | ICD-10-CM | POA: Insufficient documentation

## 2020-11-07 MED ORDER — IRON 90 (18 FE) MG PO TABS: 1.0000 | ORAL_TABLET | ORAL | 3 refills | Status: DC

## 2020-11-07 MED ORDER — OMEPRAZOLE 40 MG PO CPDR: 40.0000 mg | DELAYED_RELEASE_CAPSULE | Freq: Every day | ORAL | 2 refills | Status: DC

## 2020-11-07 NOTE — Progress Notes (Signed)
Established Patient Office Visit  Subjective:  Patient ID: Denise Alexander, female    DOB: 01/17/1971  Age: 49 y.o. MRN: [id number]  CC:  Chief Complaint  Patient presents with  . Results    lab results   . Dysmenorrhea    2-3 weeks; severe cramps in hands, feet, legs    HPI Denise Alexander presents for lab review and follow up after seeing Cardiology on 11/05/20. She was started on Imdur and Lipitor daily. She reports compliance and tolerating them well. She is anemic with a hemoglobin of 7.5, iron of 14 ug/dL and iron saturation of 4%. She reports continued fatigue and leg cramping. Denies blood in stool, dizziness or syncope. Her lab work also indicates that she is prediabetic. She reports that she drinks soda daily and not drinking water. No dizziness or peripheral neuropathy symptoms noted. Her AST and ALT were elevated. She denies hx of hepatitis and denies alcohol use. Denies abdominal pain, nausea, vomiting, diarrhea, or jaundice. Reports that she is recovering from polysubstance abuse, mainly cocaine. Smokes half a pack daily. States that she has been feeling anxious and would like to see a mental health provider. Overall, she states that her health is improving. Denies further complaint.   Past Medical History:  Diagnosis Date  . COPD (chronic obstructive pulmonary disease) ([place])   . Depression   . MI (myocardial infarction) ([place])   . Substance abuse [facility])     Past Surgical History:  Procedure Laterality Date  . LEFT HEART CATH AND CORONARY ANGIOGRAPHY Right 12/03/2017   Procedure: LEFT HEART CATH AND CORONARY ANGIOGRAPHY;  Surgeon: Dionisio David, MD;  Location: [place] [facility];  Service: Cardiovascular;  Laterality: Right;  . TUBAL LIGATION      History reviewed. No pertinent family history.  Social History   Socioeconomic History  . Marital status: Single    Spouse name: Not on file  . Number of children: Not on file  . Years of education: Not on file  .  Highest education level: Not on file  Occupational History  . Not on file  Tobacco Use  . Smoking status: Current Every Day Smoker    Packs/day: 0.50    Types: Cigarettes  . Smokeless tobacco: Never Used  . Tobacco comment: Started smoking at age 67.   Substance and Sexual Activity  . Alcohol use: No  . Drug use: Yes    Frequency: 1.0 times per week    Types: Marijuana    Comment: 1 joint/week  . Sexual activity: Not Currently  Other Topics Concern  . Not on file  Social History Narrative  . Not on file   Social Determinants of Health   Financial Resource Strain:   . Difficulty of Paying Living Expenses: Not on file  Food Insecurity:   . Worried About Charity fundraiser in the Last Year: Not on file  . Ran Out of Food in the Last Year: Not on file  Transportation Needs:   . Lack of Transportation (Medical): Not on file  . Lack of Transportation (Non-Medical): Not on file  Physical Activity:   . Days of Exercise per Week: Not on file  . Minutes of Exercise per Session: Not on file  Stress:   . Feeling of Stress : Not on file  Social Connections:   . Frequency of Communication with Friends and Family: Not on file  . Frequency of Social Gatherings with Friends and Family: Not on file  .  Attends Religious Services: Not on file  . Active Member of Clubs or Organizations: Not on file  . Attends Archivist Meetings: Not on file  . Marital Status: Not on file  Intimate Partner Violence:   . Fear of Current or Ex-Partner: Not on file  . Emotionally Abused: Not on file  . Physically Abused: Not on file  . Sexually Abused: Not on file    Outpatient Medications Prior to Visit  Medication Sig Dispense Refill  . albuterol (VENTOLIN HFA) 108 (90 Base) MCG/ACT inhaler Inhale 2 puffs into the lungs every 6 (six) hours as needed for wheezing or shortness of breath. 8 g 2  . aspirin EC 81 MG EC tablet Take 1 tablet (81 mg total) by mouth daily. 30 tablet 0  .  atorvastatin (LIPITOR) 40 MG tablet Take 1 tablet (40 mg total) by mouth daily. 30 tablet 5  . Fluticasone-Salmeterol (ADVAIR) 100-50 MCG/DOSE AEPB Inhale 1 puff into the lungs in the morning and at bedtime. 60 each 2  . isosorbide mononitrate (IMDUR) 30 MG 24 hr tablet Take 0.5 tablets (15 mg total) by mouth daily. 30 tablet 5  . nitroGLYCERIN (NITROSTAT) 0.4 MG SL tablet Place 1 tablet (0.4 mg total) under the tongue every 5 (five) minutes as needed for chest pain. 30 tablet 12  . Ferrous Sulfate (IRON) 90 (18 Fe) MG TABS Take 1 tablet by mouth every other day. 30 tablet 0  . omeprazole (PRILOSEC) 20 MG capsule Take 1 capsule (20 mg total) by mouth daily. 30 capsule 0   No facility-administered medications prior to visit.    Allergies  Allergen Reactions  . Penicillins Swelling    Has patient had a PCN reaction causing immediate rash, facial/tongue/throat swelling, SOB or lightheadedness with hypotension: Yes Has patient had a PCN reaction causing severe rash involving mucus membranes or skin necrosis: Unknown Has patient had a PCN reaction that required hospitalization: No Has patient had a PCN reaction occurring within the last 10 years: No If all of the above answers are "NO", then may proceed with Cephalosporin use.    ROS Review of Systems  Constitutional: Positive for fatigue.  HENT: Negative.   Respiratory: Negative.   Cardiovascular: Negative.   Gastrointestinal: Negative.   Endocrine: Negative.   Musculoskeletal: Positive for myalgias.  Skin: Negative.   Neurological: Negative.   Hematological: Negative.   Psychiatric/Behavioral: Positive for dysphoric mood. The patient is nervous/anxious.       Objective:    Physical Exam Constitutional:      Appearance: Normal appearance.  Cardiovascular:     Heart sounds: Normal heart sounds.  Pulmonary:     Effort: Pulmonary effort is normal.     Breath sounds: Normal breath sounds.  Abdominal:     General: Abdomen is  flat. Bowel sounds are normal.     Palpations: Abdomen is soft.  Musculoskeletal:        General: Normal range of motion.  Skin:    General: Skin is warm and dry.     Capillary Refill: Capillary refill takes less than 2 seconds.  Neurological:     General: No focal deficit present.     Mental Status: She is alert and oriented to person, place, and time.  Psychiatric:        Attention and Perception: Attention normal.        Mood and Affect: Mood is anxious.        Speech: Speech normal.  Behavior: Behavior is cooperative.        Thought Content: Thought content normal.     BP 103/67 (BP Location: Right Arm, Patient Position: Sitting, Cuff Size: Small)   Pulse 98   Wt 107 lb 6.4 oz (48.7 kg)   LMP 11/28/2017 (LMP Unknown)   SpO2 99%   BMI 19.64 kg/m  Wt Readings from Last 3 Encounters:  11/07/20 107 lb 6.4 oz (48.7 kg)  11/05/20 111 lb (50.3 kg)  10/17/20 107 lb 1.6 oz (48.6 kg)     Health Maintenance Due  Topic Date Due  . Hepatitis C Screening  Never done  . COVID-19 Vaccine (1) Never done  . TETANUS/TDAP  Never done  . PAP SMEAR-Modifier  Never done    There are no preventive care reminders to display for this patient.  Lab Results  Component Value Date   TSH 4.430 10/30/2020   Lab Results  Component Value Date   WBC 6.2 10/30/2020   HGB 7.5 (L) 10/30/2020   HCT 27.8 (L) 10/30/2020   MCV 64 (L) 10/30/2020   PLT 330 10/30/2020   Lab Results  Component Value Date   NA 143 10/30/2020   K 4.0 10/30/2020   CO2 21 10/30/2020   GLUCOSE 94 10/30/2020   BUN 7 10/30/2020   CREATININE 0.69 10/30/2020   BILITOT <0.2 10/30/2020   ALKPHOS 101 10/30/2020   AST 52 (H) 10/30/2020   ALT 66 (H) 10/30/2020   PROT 5.9 (L) 10/30/2020   ALBUMIN 3.5 (L) 10/30/2020   CALCIUM 8.7 10/30/2020   ANIONGAP 8 10/11/2020   Lab Results  Component Value Date   CHOL 133 10/30/2020   Lab Results  Component Value Date   HDL 25 (L) 10/30/2020   Lab Results  Component  Value Date   LDLCALC 93 10/30/2020   Lab Results  Component Value Date   TRIG 75 10/30/2020   Lab Results  Component Value Date   CHOLHDL 5.3 (H) 10/30/2020   Lab Results  Component Value Date   HGBA1C 5.7 (H) 10/30/2020      Assessment & Plan:   1. Elevated liver enzymes Return 12/25/20 to redraw hepatic labs. Counselled to call clinic or seek emergency care if she develops abdominal pain, nausea, vomiting, diarrhea, or yellowing of the skin. - Hepatitis, Acute; Future - Hepatic function panel; Future  2. Prediabetes Decrease soda intake. Increase water intake. Follow a low carb, low concentrated sugar diet.  Increase exercise as tolerated.  3. Iron deficiency anemia, unspecified iron deficiency anemia type Follow up with Hematology. Continue iron supplementation as ordered.  Educated to increase lean meats and dark, leafy greens in diet. Discussed reasons to seek emergency care, such as uncontrolled bleeding, blood in stools, or syncope. - Ambulatory referral to Hematology - Ferrous Sulfate (IRON) 90 (18 Fe) MG TABS; Take 1 tablet by mouth every other day.  Dispense: 30 tablet; Refill: 3  4. Coronary artery disease involving native coronary artery of native heart without angina pectoris Continue treatment plan from Cardiology, including Imdur, Lipitor, and aspirin daily as prescribed. Follow up with Cardiology as scheduled.  Continue to abstain from cocaine.  5. Gastroesophageal reflux disease, unspecified whether esophagitis present Increased omeprazole from 79m to 469mdaily.  --Avoid spicy, fatty and fried food --Avoid sodas and sour juices --Avoid heavy meals --Avoid eating 4 hours before bedtime --Elevate head of bed at night --Report to the Emergency Department with chest pain, shortness of breath, hematemesis, or severe pain.  -  omeprazole (PRILOSEC) 40 MG capsule; Take 1 capsule (40 mg total) by mouth daily.  Dispense: 30 capsule; Refill: 2  6.  DEPRESSION/ANXIETY Follow up scheduled with Jerrilyn [place].  Discussed reasons to seek emergency care, such as, suicidal thoughts, increased feelings of hopelessness or anxiety.   Follow-up: Return in about 2 months (around 01/08/2021), or if symptoms worsen or fail to improve.    Marina Gravel, Student-NP

## 2020-11-08 ENCOUNTER — Telehealth: Payer: Self-pay | Admitting: Pharmacist

## 2020-11-08 NOTE — Telephone Encounter (Signed)
11/08/2020 09:20:04 AM - [employer] application for ProAir HFA  -- Elmer Picker - Friday, November 08, 2020 11:39 AM --Virgel Gess [employer] application for enrollment--ProAir HFA 0.29mg Inhale 2 puffs into the lungs every 6 hours as needed for shortness of breath. this replaces Ventolin HFA.   11/08/2020 115:93:01AM - [place]- Friday, November 08, 2020 11:38 AM --Faxed [employer] application for enrollment --Advair 100/545m Inhale 1 puff into the lungs every morning and at bedtime

## 2020-11-21 ENCOUNTER — Encounter (INDEPENDENT_AMBULATORY_CARE_PROVIDER_SITE_OTHER): Payer: Self-pay

## 2020-11-21 ENCOUNTER — Other Ambulatory Visit: Payer: Self-pay | Admitting: *Deleted

## 2020-11-21 ENCOUNTER — Inpatient Hospital Stay: Payer: Self-pay

## 2020-11-21 ENCOUNTER — Telehealth: Payer: Self-pay | Admitting: *Deleted

## 2020-11-21 ENCOUNTER — Encounter: Payer: Self-pay | Admitting: Internal Medicine

## 2020-11-21 ENCOUNTER — Telehealth: Payer: Self-pay

## 2020-11-21 ENCOUNTER — Inpatient Hospital Stay: Payer: Self-pay | Attending: Internal Medicine | Admitting: Internal Medicine

## 2020-11-21 ENCOUNTER — Other Ambulatory Visit: Payer: Self-pay

## 2020-11-21 VITALS — BP 116/78 | HR 88 | Temp 97.0°F | Resp 18 | Ht 62.0 in | Wt 110.0 lb

## 2020-11-21 DIAGNOSIS — D509 Iron deficiency anemia, unspecified: Secondary | ICD-10-CM | POA: Insufficient documentation

## 2020-11-21 DIAGNOSIS — D649 Anemia, unspecified: Secondary | ICD-10-CM

## 2020-11-21 DIAGNOSIS — R7989 Other specified abnormal findings of blood chemistry: Secondary | ICD-10-CM | POA: Insufficient documentation

## 2020-11-21 DIAGNOSIS — F1410 Cocaine abuse, uncomplicated: Secondary | ICD-10-CM | POA: Insufficient documentation

## 2020-11-21 DIAGNOSIS — F17210 Nicotine dependence, cigarettes, uncomplicated: Secondary | ICD-10-CM | POA: Insufficient documentation

## 2020-11-21 LAB — URINALYSIS, COMPLETE (UACMP) WITH MICROSCOPIC
Bacteria, UA: NONE SEEN
Bilirubin Urine: NEGATIVE
Glucose, UA: NEGATIVE mg/dL
Hgb urine dipstick: NEGATIVE
Ketones, ur: NEGATIVE mg/dL
Leukocytes,Ua: NEGATIVE
Nitrite: NEGATIVE
Protein, ur: NEGATIVE mg/dL
Specific Gravity, Urine: 1.009 (ref 1.005–1.030)
Squamous Epithelial / HPF: NONE SEEN (ref 0–5)
WBC, UA: NONE SEEN WBC/hpf (ref 0–5)
pH: 5 (ref 5.0–8.0)

## 2020-11-21 LAB — CBC WITH DIFFERENTIAL/PLATELET
Abs Immature Granulocytes: 0.02 10*3/uL (ref 0.00–0.07)
Basophils Absolute: 0 10*3/uL (ref 0.0–0.1)
Basophils Relative: 0 %
Eosinophils Absolute: 0.1 10*3/uL (ref 0.0–0.5)
Eosinophils Relative: 2 %
HCT: 28.5 % — ABNORMAL LOW (ref 36.0–46.0)
Hemoglobin: 7.6 g/dL — ABNORMAL LOW (ref 12.0–15.0)
Immature Granulocytes: 0 %
Lymphocytes Relative: 45 %
Lymphs Abs: 2.5 10*3/uL (ref 0.7–4.0)
MCH: 17.4 pg — ABNORMAL LOW (ref 26.0–34.0)
MCHC: 26.7 g/dL — ABNORMAL LOW (ref 30.0–36.0)
MCV: 65.1 fL — ABNORMAL LOW (ref 80.0–100.0)
Monocytes Absolute: 0.4 10*3/uL (ref 0.1–1.0)
Monocytes Relative: 8 %
Neutro Abs: 2.5 10*3/uL (ref 1.7–7.7)
Neutrophils Relative %: 45 %
Platelets: 254 10*3/uL (ref 150–400)
RBC: 4.38 MIL/uL (ref 3.87–5.11)
RDW: 22.8 % — ABNORMAL HIGH (ref 11.5–15.5)
WBC: 5.5 10*3/uL (ref 4.0–10.5)
nRBC: 0 % (ref 0.0–0.2)

## 2020-11-21 LAB — COMPREHENSIVE METABOLIC PANEL
ALT: 71 U/L — ABNORMAL HIGH (ref 0–44)
AST: 66 U/L — ABNORMAL HIGH (ref 15–41)
Albumin: 3.4 g/dL — ABNORMAL LOW (ref 3.5–5.0)
Alkaline Phosphatase: 75 U/L (ref 38–126)
Anion gap: 9 (ref 5–15)
BUN: 13 mg/dL (ref 6–20)
CO2: 28 mmol/L (ref 22–32)
Calcium: 8.9 mg/dL (ref 8.9–10.3)
Chloride: 102 mmol/L (ref 98–111)
Creatinine, Ser: 0.61 mg/dL (ref 0.44–1.00)
GFR, Estimated: >60 mL/min (ref >60)
Glucose, Bld: 83 mg/dL (ref 70–99)
Potassium: 3.9 mmol/L (ref 3.5–5.1)
Sodium: 139 mmol/L (ref 135–145)
Total Bilirubin: 0.3 mg/dL (ref 0.3–1.2)
Total Protein: 7.3 g/dL (ref 6.5–8.1)

## 2020-11-21 LAB — HEPATITIS PANEL, ACUTE
HCV Ab: NONREACTIVE
Hep A IgM: NONREACTIVE
Hep B C IgM: NONREACTIVE
Hepatitis B Surface Ag: NONREACTIVE

## 2020-11-21 LAB — SAMPLE TO BLOOD BANK

## 2020-11-21 LAB — RETICULOCYTES
Immature Retic Fract: 21.6 % — ABNORMAL HIGH (ref 2.3–15.9)
RBC.: 4.28 MIL/uL (ref 3.87–5.11)
Retic Count, Absolute: 56.1 10*3/uL (ref 19.0–186.0)
Retic Ct Pct: 1.3 % (ref 0.4–3.1)

## 2020-11-21 LAB — VITAMIN B12: Vitamin B-12: 232 pg/mL (ref 180–914)

## 2020-11-21 LAB — FERRITIN: Ferritin: 4 ng/mL — ABNORMAL LOW (ref 11–307)

## 2020-11-21 LAB — LACTATE DEHYDROGENASE: LDH: 200 U/L — ABNORMAL HIGH (ref 98–192)

## 2020-11-21 LAB — PREPARE RBC (CROSSMATCH)

## 2020-11-21 LAB — ABO/RH: ABO/RH(D): O POS

## 2020-11-21 NOTE — Telephone Encounter (Signed)
Patient aware that she needs 1 unit of blood tomorrow.  Dr. Harl Bowie advise on future follow-up apts

## 2020-11-21 NOTE — Progress Notes (Signed)
[facility] CONSULT NOTE  Patient Care Team: Patient, No Pcp Per as PCP - General (General Practice) Kate Sable, MD as PCP - Cardiology (Cardiology) Corey Skains, MD as Consulting Physician (Cardiology)  CHIEF COMPLAINTS/PURPOSE OF CONSULTATION: Iron deficiency anemia   HEMATOLOGY HISTORY  # ANEMIA NOV 2021 Iron sat 4%; Hb 7.5  EGD-none; colonoscopy [2012; [facility]];   # CHD/CAD [2018-non obstructive NSTEMI/CAD (LHC 2018 -50% ost 1st diagonal), COPD, cocaine use, current smoker x 30+yrs ]; COPD [active smoker]  HISTORY OF PRESENTING ILLNESS:  Denise Alexander 49 y.o.  female has been referred to [place] for further evaluation/work-up for anemia.  Patient states she has chronic anemia.  However denies any blood in stools or black-colored stools.  Denies any menstrual bleeding.  Denies any blood in urine.  Patient complains of significant shortness of breath on exertion.  Also complains of significant fatigue.  No swelling in the legs.  Complains of dizziness especially on standing.  Blood in stools: None Change in bowel habits- None Blood in urine: None Difficulty swallowing: None Abnormal weight loss: None Iron supplementation: on PO iron x 2 weeks.  Prior Blood transfusions: remote [facility] transfusion.   Vaginal bleeding: None  Review of Systems  Constitutional: Positive for malaise/fatigue. Negative for chills, diaphoresis, fever and weight loss.  HENT: Negative for nosebleeds and sore throat.   Eyes: Negative for double vision.  Respiratory: Positive for shortness of breath. Negative for cough, hemoptysis, sputum production and wheezing.   Cardiovascular: Negative for chest pain, palpitations, orthopnea and leg swelling.  Gastrointestinal: Negative for abdominal pain, blood in stool, constipation, diarrhea, heartburn, melena, nausea and vomiting.  Genitourinary: Negative for dysuria, frequency and urgency.  Musculoskeletal: Positive for back pain. Negative for  joint pain.  Skin: Negative.  Negative for itching and rash.  Neurological: Positive for dizziness. Negative for tingling, focal weakness, weakness and headaches.  Endo/Heme/Allergies: Does not bruise/bleed easily.  Psychiatric/Behavioral: Negative for depression. The patient is not nervous/anxious and does not have insomnia.     MEDICAL HISTORY:  Past Medical History:  Diagnosis Date  . Anemia   . COPD (chronic obstructive pulmonary disease) ([place])   . Depression   . MI (myocardial infarction) ([place])   . Substance abuse ([place])     SURGICAL HISTORY: Past Surgical History:  Procedure Laterality Date  . LEFT HEART CATH AND CORONARY ANGIOGRAPHY Right 12/03/2017   Procedure: LEFT HEART CATH AND CORONARY ANGIOGRAPHY;  Surgeon: Dionisio David, MD;  Location: [facility];  Service: Cardiovascular;  Laterality: Right;  . TUBAL LIGATION      SOCIAL HISTORY: Social History   Socioeconomic History  . Marital status: Single    Spouse name: Not on file  . Number of children: Not on file  . Years of education: Not on file  . Highest education level: Not on file  Occupational History  . Not on file  Tobacco Use  . Smoking status: Current Every Day Smoker    Packs/day: 0.50    Types: Cigarettes  . Smokeless tobacco: Never Used  . Tobacco comment: Started smoking at age 68.   Substance and Sexual Activity  . Alcohol use: No  . Drug use: Yes    Frequency: 1.0 times per week    Types: Marijuana    Comment: 1 joint/week  . Sexual activity: Not Currently  Other Topics Concern  . Not on file  Social History Narrative   Lives in [place]; works in group home; smoker; alcohol;  hx of cocaine/marijuana [end of Nov 2021].    Social Determinants of Health   Financial Resource Strain:   . Difficulty of Paying Living Expenses: Not on file  Food Insecurity:   . Worried About Charity fundraiser in the Last Year: Not on file  . Ran Out of Food in the Last Year: Not on file   Transportation Needs:   . Lack of Transportation (Medical): Not on file  . Lack of Transportation (Non-Medical): Not on file  Physical Activity:   . Days of Exercise per Week: Not on file  . Minutes of Exercise per Session: Not on file  Stress:   . Feeling of Stress : Not on file  Social Connections:   . Frequency of Communication with Friends and Family: Not on file  . Frequency of Social Gatherings with Friends and Family: Not on file  . Attends Religious Services: Not on file  . Active Member of Clubs or Organizations: Not on file  . Attends Archivist Meetings: Not on file  . Marital Status: Not on file  Intimate Partner Violence:   . Fear of Current or Ex-Partner: Not on file  . Emotionally Abused: Not on file  . Physically Abused: Not on file  . Sexually Abused: Not on file    FAMILY HISTORY: Family History  Problem Relation Age of Onset  . Colon cancer Mother   . Ovarian cancer Mother   . Throat cancer Mother   . Skin cancer Mother     ALLERGIES:  is allergic to penicillins.  MEDICATIONS:  Current Outpatient Medications  Medication Sig Dispense Refill  . albuterol (VENTOLIN HFA) 108 (90 Base) MCG/ACT inhaler Inhale 2 puffs into the lungs every 6 (six) hours as needed for wheezing or shortness of breath. 8 g 2  . aspirin EC 81 MG EC tablet Take 1 tablet (81 mg total) by mouth daily. 30 tablet 0  . atorvastatin (LIPITOR) 40 MG tablet Take 1 tablet (40 mg total) by mouth daily. 30 tablet 5  . Ferrous Sulfate (IRON) 90 (18 Fe) MG TABS Take 1 tablet by mouth every other day. 30 tablet 3  . Fluticasone-Salmeterol (ADVAIR) 100-50 MCG/DOSE AEPB Inhale 1 puff into the lungs in the morning and at bedtime. 60 each 2  . isosorbide mononitrate (IMDUR) 30 MG 24 hr tablet Take 0.5 tablets (15 mg total) by mouth daily. 30 tablet 5  . omeprazole (PRILOSEC) 40 MG capsule Take 1 capsule (40 mg total) by mouth daily. 30 capsule 2  . nitroGLYCERIN (NITROSTAT) 0.4 MG SL tablet  Place 1 tablet (0.4 mg total) under the tongue every 5 (five) minutes as needed for chest pain. (Patient not taking: Reported on 11/21/2020) 30 tablet 12   No current facility-administered medications for this visit.      PHYSICAL EXAMINATION:   Vitals:   11/21/20 1145  BP: 116/78  Pulse: 88  Resp: 18  Temp: (!) 97 F (36.1 C)   Filed Weights   11/21/20 1146  Weight: 110 lb (49.9 kg)    Physical Exam HENT:     Head: Normocephalic and atraumatic.     Mouth/Throat:     Pharynx: No oropharyngeal exudate.  Eyes:     Pupils: Pupils are equal, round, and reactive to light.  Cardiovascular:     Rate and Rhythm: Normal rate and regular rhythm.  Pulmonary:     Effort: No respiratory distress.     Breath sounds: No wheezing.  Abdominal:  General: Bowel sounds are normal. There is no distension.     Palpations: Abdomen is soft. There is no mass.     Tenderness: There is no abdominal tenderness. There is no guarding or rebound.  Musculoskeletal:        General: No tenderness. Normal range of motion.     Cervical back: Normal range of motion and neck supple.  Skin:    General: Skin is warm.  Neurological:     Mental Status: She is alert and oriented to person, place, and time.  Psychiatric:        Mood and Affect: Affect normal.     LABORATORY DATA:  I have reviewed the data as listed Lab Results  Component Value Date   WBC 5.5 11/21/2020   HGB 7.6 (L) 11/21/2020   HCT 28.5 (L) 11/21/2020   MCV 65.1 (L) 11/21/2020   PLT 254 11/21/2020   Recent Labs    10/11/20 1122 10/30/20 1007 11/21/20 1246  NA 140 143 139  K 4.3 4.0 3.9  CL 110 109* 102  CO2 22 21 28   GLUCOSE 95 94 83  BUN 8 7 13   CREATININE 0.66 0.69 0.61  CALCIUM 8.6* 8.7 8.9  GFRNONAA >60 103 >60  GFRAA  --  119  --   PROT  --  5.9* 7.3  ALBUMIN  --  3.5* 3.4*  AST  --  52* 66*  ALT  --  66* 71*  ALKPHOS  --  101 75  BILITOT  --  <0.2 0.3     No results found.  Symptomatic  anemia #Anemia-iron deficiency [nadir 7.2;]-given quite symptomatic anemia.  Recommend IV iron infusion.  Also discussed regarding 1 unit of PRBC transfusion.  Discussed the potential acute infusion reactions with IV iron; which are quite rare.  Patient understands the risk; will proceed with infusions.  #Etiology:  Unclear; recent UA negative.  Recommend GI evaluation ASAP. If negative would recommend CT A/P.   #Illicit drug abuse [cocaine]: Discussed the importance of stopping use of illicit drugs as that is in general harmful for health; and also would interfere with need for sedation for any of the GI procedures.  # Discussed with the patient regarding the ill effects of smoking- including but not limited to cardiac lung and vascular diseases and malignancies. Counseled against smoking.   #Elevated LFTs-check acute hepatitis panel.   Thank you for allowing me to participate in the care of your pleasant patient. Please do not hesitate to contact me with questions or concerns in the interim.  # DISPOSITION:  # labs today- add stool cards x3.  # [facility] referral re; Anemia # possible 1 unit PRBC on 11/22/21 :) # follow up TBD- Dr.B  Addendum:  On 12/02-left voicemail for the patient discussing the need for blood transfusion. C-schedule IV Feraheme infusion-weekly x 3; start next week Follow up in 3rd week of jan 2021- MD- labs-cbc/cmp; LDH; possible ferrahem.    All questions were answered. The patient knows to call the clinic with any problems, questions or concerns.    Cammie Sickle, MD 11/22/2020 8:13 AM

## 2020-11-21 NOTE — Assessment & Plan Note (Addendum)
#  Anemia-iron deficiency [nadir 7.2;]-given quite symptomatic anemia.  Recommend IV iron infusion.  Also discussed regarding 1 unit of PRBC transfusion.  Discussed the potential acute infusion reactions with IV iron; which are quite rare.  Patient understands the risk; will proceed with infusions.  #Etiology:  Unclear; recent UA negative.  Recommend GI evaluation ASAP. If negative would recommend CT A/P.   #Illicit drug abuse [cocaine]: Discussed the importance of stopping use of illicit drugs as that is in general harmful for health; and also would interfere with need for sedation for any of the GI procedures.  # Discussed with the patient regarding the ill effects of smoking- including but not limited to cardiac lung and vascular diseases and malignancies. Counseled against smoking.   #Elevated LFTs-check acute hepatitis panel.   Thank you for allowing me to participate in the care of your pleasant patient. Please do not hesitate to contact me with questions or concerns in the interim.  # DISPOSITION:  # labs today- add stool cards x3.  #  GI referral re; Anemia # possible 1 unit PRBC on 11/22/21 :) # follow up TBD- Dr.B  Addendum:  On 12/02-left voicemail for the patient discussing the need for blood transfusion. C-schedule IV Feraheme infusion-weekly x 3; start next week Follow up in 3rd week of jan 2021- MD- labs-cbc/cmp; LDH; possible ferrahem.

## 2020-11-21 NOTE — Telephone Encounter (Signed)
Error

## 2020-11-22 ENCOUNTER — Telehealth: Payer: Self-pay | Admitting: Internal Medicine

## 2020-11-22 ENCOUNTER — Inpatient Hospital Stay: Payer: Self-pay

## 2020-11-22 DIAGNOSIS — D649 Anemia, unspecified: Secondary | ICD-10-CM

## 2020-11-22 MED ORDER — DIPHENHYDRAMINE HCL 25 MG PO CAPS
25.0000 mg | ORAL_CAPSULE | Freq: Once | ORAL | Status: AC
  Administered 2020-11-22: 25 mg via ORAL
  Filled 2020-11-22: qty 1

## 2020-11-22 MED ORDER — ACETAMINOPHEN 325 MG PO TABS
650.0000 mg | ORAL_TABLET | Freq: Once | ORAL | Status: AC
  Administered 2020-11-22: 650 mg via ORAL
  Filled 2020-11-22: qty 2

## 2020-11-22 MED ORDER — SODIUM CHLORIDE 0.9% IV SOLUTION
250.0000 mL | Freq: Once | INTRAVENOUS | Status: AC
  Administered 2020-11-22: 250 mL via INTRAVENOUS
  Filled 2020-11-22: qty 250

## 2020-11-22 NOTE — Telephone Encounter (Signed)
Addendum: On 12/02-left voicemail for the patient discussing the need for blood transfusion.  C-schedule IV Feraheme infusion-weekly x 3; start next week  Follow up in 3rd week of jan 2021- MD- labs-cbc/cmp; LDH; possible ferrahem.   Thanks GB

## 2020-11-22 NOTE — Progress Notes (Signed)
1208- Patient tolerated blood transfusion well. Patient and vital signs stable. Patient discharged to home at this time.

## 2020-11-22 NOTE — Telephone Encounter (Signed)
Labs ordered.

## 2020-11-22 NOTE — Addendum Note (Signed)
Addended by: Delice Bison E on: 11/22/2020 08:25 AM   Modules accepted: Orders

## 2020-11-23 LAB — TYPE AND SCREEN
ABO/RH(D): O POS
Antibody Screen: NEGATIVE
Unit division: 0

## 2020-11-23 LAB — BPAM RBC
Blood Product Expiration Date: 202201032359
ISSUE DATE / TIME: 202112031006
Unit Type and Rh: 5100

## 2020-11-25 ENCOUNTER — Other Ambulatory Visit: Payer: Self-pay | Admitting: Gerontology

## 2020-11-26 ENCOUNTER — Other Ambulatory Visit: Payer: Self-pay

## 2020-11-26 ENCOUNTER — Ambulatory Visit (INDEPENDENT_AMBULATORY_CARE_PROVIDER_SITE_OTHER): Payer: Self-pay

## 2020-11-26 DIAGNOSIS — I2510 Atherosclerotic heart disease of native coronary artery without angina pectoris: Secondary | ICD-10-CM

## 2020-11-27 LAB — ECHOCARDIOGRAM COMPLETE
Area-P 1/2: 3.56 cm2
S' Lateral: 3.3 cm

## 2020-11-27 NOTE — Progress Notes (Signed)
..  The following Medication: Denise Alexander has been approved thru [employer]. Approved for 2 doses on 11/26/2020. Effective dates 11/26/2020 to 11/26/2021. Assistance ID: [id number] . Medication is ordered as Assistance to have on hand prior to treatment. First DOS: 11/28/2020 Next DOS: 12/05/2020 **additional doses, require new script sent to [facility].

## 2020-11-28 ENCOUNTER — Inpatient Hospital Stay: Payer: Self-pay

## 2020-11-28 VITALS — BP 129/85 | HR 88 | Temp 96.5°F | Resp 18

## 2020-11-28 DIAGNOSIS — D649 Anemia, unspecified: Secondary | ICD-10-CM

## 2020-11-28 MED ORDER — SODIUM CHLORIDE 0.9 % IV SOLN
510.0000 mg | Freq: Once | INTRAVENOUS | Status: AC
  Administered 2020-11-28: 510 mg via INTRAVENOUS
  Filled 2020-11-28: qty 510

## 2020-11-28 MED ORDER — SODIUM CHLORIDE 0.9 % IV SOLN
Freq: Once | INTRAVENOUS | Status: AC
  Filled 2020-11-28: qty 250

## 2020-11-28 NOTE — Progress Notes (Signed)
1440: Pt here for 1st time feraheme. pt reports a "sharp pain radiating through her body into her feet"  Dr. Rogue Bussing and Beckey Rutter NP aware.   1400: Beckey Rutter NP at chairside to assess pt and discuss plan. Per Beckey Rutter NP okay to proceed with Feraheme at this time.    1500: Pt tolerated Feraheme infusion well. No s/s of distress or reaction noted. Pt and VS stable at discharge.

## 2020-12-04 ENCOUNTER — Telehealth: Payer: Self-pay | Admitting: Cardiology

## 2020-12-04 ENCOUNTER — Encounter: Payer: Self-pay | Admitting: Gastroenterology

## 2020-12-04 ENCOUNTER — Ambulatory Visit (INDEPENDENT_AMBULATORY_CARE_PROVIDER_SITE_OTHER): Payer: Self-pay | Admitting: Gastroenterology

## 2020-12-04 ENCOUNTER — Telehealth: Payer: Self-pay

## 2020-12-04 ENCOUNTER — Other Ambulatory Visit: Payer: Self-pay

## 2020-12-04 VITALS — BP 101/67 | HR 86 | Ht 62.0 in | Wt 108.2 lb

## 2020-12-04 DIAGNOSIS — K900 Celiac disease: Secondary | ICD-10-CM

## 2020-12-04 DIAGNOSIS — R748 Abnormal levels of other serum enzymes: Secondary | ICD-10-CM

## 2020-12-04 DIAGNOSIS — Z1211 Encounter for screening for malignant neoplasm of colon: Secondary | ICD-10-CM

## 2020-12-04 DIAGNOSIS — D509 Iron deficiency anemia, unspecified: Secondary | ICD-10-CM

## 2020-12-04 DIAGNOSIS — R109 Unspecified abdominal pain: Secondary | ICD-10-CM

## 2020-12-04 MED ORDER — NA SULFATE-K SULFATE-MG SULF 17.5-3.13-1.6 GM/177ML PO SOLN: ORAL | 0 refills | Status: DC

## 2020-12-04 NOTE — Patient Instructions (Addendum)
Please do not forget to go and see your cardiologist since we will be needing cardiac clearance from your physician in order to proceed with your procedures. If we do not obtain clearance from your physician, we will call you to reschedule your procedures until we get cardiac clearance.  When you go to your CT Scan, please do not eat or drink 4 hours before. Please arrive 30 minutes prior to your appointment.

## 2020-12-04 NOTE — Progress Notes (Signed)
[name], [place] [address]  Main: [phone]  Fax: [phone]   Gastroenterology Consultation  Referring Provider:     Cammie Sickle, * Primary Care Physician:  Patient, No Pcp Per Reason for Consultation:    Anemia, elevated liver enzymes        HPI:    Chief Complaint  Patient presents with  . New Patient (Initial Visit)    Urgent referral, anemia and elevated LFT's    Denise Alexander is a 49 y.o. y/o female referred for consultation & management  by Dr. Patient, No Pcp Per.  Patient with chronic iron deficiency anemia referred by hematology due to iron deficiency anemia and elevated liver enzymes.  Patient has a history of cocaine use with ongoing use.  Was previously seen in 2007 with Dr. Allen Norris.  Clinic notes not available.  However, patient had EGD and colonoscopy in 2007, see provation, that was done for abdominal pain and diarrhea.  EGD showed nodular duodenal mucosa and biopsies were done and was otherwise normal.  Colonoscopy was normal and random biopsies were done.  Pathology report not available  However, there is a pathology report available in her chart from 2004 as follows: 1. DUODENUM, BIOPSY: FRAGMENTS OF DUODENAL MUCOSA WITH VILLOUS  ATROPHY, INCREASED CHRONIC INFLAMMATORY CELLS IN THE LAMINA  [place] AND INCREASED INTRAEPITHELIAL LYMPHOCYTES.   2. TERMINAL ILEUM, BIOPSY: SMALL INTESTINAL (ILEAL) MUCOSA WITH  NO EVIDENCE OF ACTIVE ILEITIS. FOCAL REACTIVE LYMPHOID  AGGREGATES. NO VILLOUS ATROPHY IDENTIFIED.   3. COLON, RANDOM BIOPSIES: FOCAL INCREASED INTRAEPITHELIAL  LYMPHOCYTES.   4. COLON AT 15 CM: HYPERPLASTIC POLYP. NO ADENOMATOUS CHANGE  OR MALIGNANCY IDENTIFIED.   COMMENT  1. 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 Clinical and  endoscopic correlation would be helpful.   Dr. Golden Circle concurs.   3. Although not diagnostic of lymphocytic colitis, there is at  least  focally, an increase in intraepithelial lymphocytes in the  crypt epithelium.   Patient denies any episodes of bleeding.  However, she does describe chronic abdominal pain with and without meals.  It does worsen with meals.  No nausea or vomiting.  Reports 2-3 bowel movements in the morning that are not loose, and not hard.  Reports them to be normal consistency.  Denies any history of heavy alcohol use   Past Medical History:  Diagnosis Date  . Anemia   . COPD (chronic obstructive pulmonary disease) ([place])   . Depression   . MI (myocardial infarction) ([place])   . Substance abuse [facility])     Past Surgical History:  Procedure Laterality Date  . LEFT HEART CATH AND CORONARY ANGIOGRAPHY Right 12/03/2017   Procedure: LEFT HEART CATH AND CORONARY ANGIOGRAPHY;  Surgeon: Dionisio David, MD;  Location: [facility];  Service: Cardiovascular;  Laterality: Right;  . TUBAL LIGATION      Prior to Admission medications   Medication Sig Start Date End Date Taking? Authorizing Provider  aspirin EC 81 MG EC tablet Take 1 tablet (81 mg total) by mouth daily. 12/04/17  Yes Fritzi Mandes, MD  atorvastatin (LIPITOR) 40 MG tablet Take 1 tablet (40 mg total) by mouth daily. 11/05/20 02/03/21 Yes Agbor-Etang, Aaron Edelman, MD  Ferrous Sulfate (IRON) 90 (18 Fe) MG TABS Take 1 tablet by mouth every other day. 11/07/20  Yes Iloabachie, Chioma E, NP  Fluticasone-Salmeterol (ADVAIR) 100-50 MCG/DOSE AEPB Inhale 1 puff into the lungs in the morning and at bedtime. 10/17/20  Yes Iloabachie, Chioma E, NP  isosorbide mononitrate (IMDUR) 30 MG 24 hr tablet Take 0.5 tablets (15 mg total) by mouth daily. 11/05/20 02/03/21 Yes Agbor-Etang, Aaron Edelman, MD  omeprazole (PRILOSEC) 40 MG capsule Take 1 capsule (40 mg total) by mouth daily. 11/07/20  Yes Iloabachie, Chioma E, NP  VENTOLIN HFA 108 (90 Base) MCG/ACT inhaler INHALE 2 PUFFS INTO THE LUNGS EVERY 6 HOURS AS NEEDED FOR WHEEZING OR SHORTNESS OF BREATH. 11/26/20  Yes Iloabachie,  Chioma E, NP  nitroGLYCERIN (NITROSTAT) 0.4 MG SL tablet Place 1 tablet (0.4 mg total) under the tongue every 5 (five) minutes as needed for chest pain. Patient not taking: No sig reported 10/17/20   Iloabachie, Chioma E, NP    Family History  Problem Relation Age of Onset  . Colon cancer Mother   . Ovarian cancer Mother   . Throat cancer Mother   . Skin cancer Mother      Social History   Tobacco Use  . Smoking status: Current Every Day Smoker    Packs/day: 0.50    Types: Cigarettes  . Smokeless tobacco: Never Used  . Tobacco comment: Started smoking at age 69.   Substance Use Topics  . Alcohol use: No  . Drug use: Yes    Frequency: 1.0 times per week    Types: Marijuana    Comment: 1 joint/week    Allergies as of 12/04/2020 - Review Complete 12/04/2020  Allergen Reaction Noted  . Penicillins Swelling 08/12/2017    Review of Systems:    All systems reviewed and negative except where noted in HPI.   Physical Exam:  BP 101/67 (BP Location: Right Arm, Patient Position: Sitting, Cuff Size: Normal)   Pulse 86   Ht 5' 2"  (1.575 m)   Wt 108 lb 3.2 oz (49.1 kg)   LMP 11/28/2017 (LMP Unknown)   BMI 19.79 kg/m  Patient's last menstrual period was 11/28/2017 (lmp unknown). Psych:  Alert and cooperative. Normal mood and affect. General:   Alert,  Well-developed, well-nourished, pleasant and cooperative in NAD Head:  Normocephalic and atraumatic. Eyes:  Sclera clear, no icterus.   Conjunctiva pink. Ears:  Normal auditory acuity. Nose:  No deformity, discharge, or lesions. Mouth:  No deformity or lesions,oropharynx pink & moist. Neck:  Supple; no masses or thyromegaly. Abdomen:  Normal bowel sounds.  No bruits.  Soft, non-tender and non-distended without masses, hepatosplenomegaly or hernias noted.  No guarding or rebound tenderness.    Msk:  Symmetrical without gross deformities. Good, equal movement & strength bilaterally. Pulses:  Normal pulses noted. Extremities:  No  clubbing or edema.  No cyanosis. Neurologic:  Alert and oriented x3;  grossly normal neurologically. Skin:  Intact without significant lesions or rashes. No jaundice. Lymph Nodes:  No significant cervical adenopathy. Psych:  Alert and cooperative. Normal mood and affect.   Labs: CBC    Component Value Date/Time   WBC 5.5 11/21/2020 1246   RBC 4.28 11/21/2020 1246   RBC 4.38 11/21/2020 1246   HGB 7.6 (L) 11/21/2020 1246   HGB 7.5 (L) 10/30/2020 1007   HCT 28.5 (L) 11/21/2020 1246   HCT 27.8 (L) 10/30/2020 1007   PLT 254 11/21/2020 1246   PLT 330 10/30/2020 1007   MCV 65.1 (L) 11/21/2020 1246   MCV 64 (L) 10/30/2020 1007   MCH 17.4 (L) 11/21/2020 1246   MCHC 26.7 (L) 11/21/2020 1246   RDW 22.8 (H) 11/21/2020 1246   RDW 21.0 (H) 10/30/2020 1007   LYMPHSABS 2.5 11/21/2020  1246   LYMPHSABS 2.2 10/30/2020 1007   MONOABS 0.4 11/21/2020 1246   EOSABS 0.1 11/21/2020 1246   EOSABS 0.1 10/30/2020 1007   BASOSABS 0.0 11/21/2020 1246   BASOSABS 0.0 10/30/2020 1007   CMP     Component Value Date/Time   NA 139 11/21/2020 1246   NA 143 10/30/2020 1007   K 3.9 11/21/2020 1246   CL 102 11/21/2020 1246   CO2 28 11/21/2020 1246   GLUCOSE 83 11/21/2020 1246   BUN 13 11/21/2020 1246   BUN 7 10/30/2020 1007   CREATININE 0.61 11/21/2020 1246   CALCIUM 8.9 11/21/2020 1246   PROT 7.3 11/21/2020 1246   PROT 5.9 (L) 10/30/2020 1007   ALBUMIN 3.4 (L) 11/21/2020 1246   ALBUMIN 3.5 (L) 10/30/2020 1007   AST 66 (H) 11/21/2020 1246   ALT 71 (H) 11/21/2020 1246   ALKPHOS 75 11/21/2020 1246   BILITOT 0.3 11/21/2020 1246   BILITOT <0.2 10/30/2020 1007   GFRNONAA >60 11/21/2020 1246   GFRAA 119 10/30/2020 1007    Imaging Studies: ECHOCARDIOGRAM COMPLETE  Result Date: 11/27/2020    ECHOCARDIOGRAM REPORT   Patient Name:   SHAVETTE SHOAFF Date of Exam: 11/26/2020 Medical Rec #:  [id number]     Height:       62.0 in Accession #:    [id number]    Weight:       110.0 lb Date of Birth:  [birth year]      BSA:          1.483 m Patient Age:    46 years      BP:           118/74 mmHg Patient Gender: F             HR:           82 bpm. 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 CAD, COPD, Signs/Symptoms:Shortness of                 Breath; Risk Factors:Substance Abuse and Current Smoker.  Sonographer:    Geradine Girt Referring Phys: [id number] BRIAN AGBOR-ETANG  Sonographer Comments: Image acquisition challenging due to COPD and Image acquisition challenging due to patient body habitus. IMPRESSIONS  1. Left ventricular ejection fraction, by estimation, is 55 to 60%. The left ventricle has normal function. The left ventricle has no regional wall motion abnormalities. Left ventricular diastolic parameters were normal.  2. Right ventricular systolic function is normal. The right ventricular size is normal.  3. The mitral valve is normal in structure. Trivial mitral valve regurgitation. No evidence of mitral stenosis.  4. The aortic valve has an indeterminant number of cusps. Aortic valve regurgitation is not visualized. No aortic stenosis is present.  5. There are ill-defined echos in the main pulmonary artery on the parasternal short axis images, most likely representing artifact. However, pulmonary embolism cannot be excluded.  6. The inferior vena cava is normal in size with greater than 50% respiratory variability, suggesting right atrial pressure of 3 mmHg. Conclusion(s)/Recommendation(s): Ill-defined echos in pulmonary artery most likely  represent artifact but cannot exclude pulmonary embolism. Of note, CTA chest in 09/2020 was negative for PE. Clinical correlation advised. Findings and recommendations were conveyed to Dr. Garen Lah by Dr. Saunders Revel  on 11/27/2020 at 6:58 AM. FINDINGS  Left Ventricle: Left ventricular ejection fraction, by estimation, is 55 to 60%. The left ventricle has normal function. The left ventricle has no regional wall motion abnormalities. The left ventricular internal cavity size was normal in size. There is  no left ventricular hypertrophy. Left ventricular diastolic parameters were normal. Right Ventricle: The right ventricular size is normal. No increase in right ventricular wall thickness. Right ventricular systolic function is normal. Left Atrium: Left atrial size was normal in size. Right Atrium: Right atrial size was normal in size. Pericardium: There is no evidence of pericardial effusion. Mitral Valve: The mitral valve is normal in structure. Trivial mitral valve regurgitation. No evidence of mitral valve stenosis. Tricuspid Valve: The tricuspid valve is normal in structure. Tricuspid valve regurgitation is trivial. Aortic Valve: The aortic valve has an indeterminant number of cusps. Aortic valve regurgitation is not visualized. No aortic stenosis is present. Pulmonic Valve: The pulmonic valve was not well visualized. Pulmonic valve regurgitation is not visualized. No evidence of pulmonic stenosis. Aorta: The aortic root is normal in size and structure. Pulmonary Artery: There are ill-defined echos in the main pulmonary artery on the parasternal short axis images, most likely representing artifact. However, pulmonary embolism cannot be excluded. The pulmonary artery is not well seen. Venous: The inferior vena cava is normal in size with greater than 50% respiratory variability, suggesting right atrial pressure of 3 mmHg. IAS/Shunts: No atrial level shunt detected by color flow Doppler.  LEFT VENTRICLE PLAX 2D LVIDd:         4.60 cm  Diastology LVIDs:         3.30 cm  LV e' medial:    9.03 cm/s LV PW:         0.69 cm  LV E/e' medial:  7.1 LV IVS:        0.80 cm  LV e' lateral:   10.30 cm/s LVOT diam:     1.90 cm  LV E/e'  lateral: 6.2 LV SV:         59 LV SV Index:   40 LVOT Area:     2.84 cm  RIGHT VENTRICLE RV S prime:     15.80 cm/s TAPSE (M-mode): 2.0 cm LEFT ATRIUM             Index       RIGHT ATRIUM           Index LA diam:        3.40 cm 2.29 cm/m  RA Area:     11.70 cm LA Vol (A2C):   59.3 ml 39.99 ml/m RA Volume:   25.80 ml  17.40 ml/m LA Vol (A4C):   25.0 ml 16.86 ml/m LA Biplane Vol: 41.4 ml 27.92 ml/m  AORTIC VALVE LVOT Vmax:   94.00 cm/s LVOT Vmean:  69.000 cm/s LVOT VTI:    0.207 m  AORTA Ao Root diam: 2.40 cm Ao Asc diam:  2.60 cm MITRAL VALVE MV Area (PHT): 3.56 cm    SHUNTS MV Decel Time: 213 msec    Systemic VTI:  0.21 m MV E velocity: 63.80 cm/s  Systemic Diam: 1.90 cm MV A velocity: 96.00 cm/s MV E/A ratio:  0.66 Harrell Gave End MD Electronically signed by Nelva Bush MD Signature Date/Time: 11/27/2020/7:00:42 AM  Final     Assessment and Plan:   YULENI BURICH is a 49 y.o. y/o female has been referred for iron deficiency anemia and elevated liver enzymes  Patient is iron deficiency anemia is likely due to her previous diagnosis of celiac sprue.  She does eat a gluten-containing diet  She does not have any evidence of bleeding.  She will need cardiology clearance prior to her EGD and colonoscopy.  She has an appointment with her cardiologist next week and they are considering TEE.  Therefore, it is important for her to obtain cardiology clearance to avoid any complications during the procedure  Due to her abdominal pain, that worsens with meals, accompanied with her ongoing cocaine use, important to rule out mesenteric ischemia.  Will obtain CTA abdomen  Obtain further work-up of elevated liver enzymes.  Acute hepatitis panel negative.  CT would also evaluate her liver  Patient was advised to abstain from illicit drug use    Dr [name]  Speech recognition software was used to dictate the above note.

## 2020-12-04 NOTE — Telephone Encounter (Signed)
Cardiac clearance form was faxed to Dr. Garen Lah. Once we obtain clearance, then patient will be able to get her EGD and colonoscopy done on 01/01/2021.

## 2020-12-04 NOTE — Telephone Encounter (Signed)
° °  [facility] Pre-operative Risk Assessment    HEARTCARE STAFF: - Please ensure there is not already an duplicate clearance open for this procedure. - Under Visit Info/Reason for Call, type in Other and utilize the format Clearance MM/DD/YY or Clearance TBD. Do not use dashes or single digits. - If request is for dental extraction, please clarify the # of teeth to be extracted.  Request for surgical clearance:  1. What type of surgery is being performed? EGD and colonoscopy  2. When is this surgery scheduled? 01/01/21  3. What type of clearance is required (medical clearance vs. Pharmacy clearance to hold med vs. Both)? Not noted  4. Are there any medications that need to be held prior to surgery and how long? Not noted  5. Practice name and name of physician performing surgery? [facility] Dr. Darlyn Chamber   6. What is the office phone number? [phone]   7.   What is the office fax number? [phone]  8.   Anesthesia type (None, local, MAC, general) ? Not noted   Denise Alexander 12/04/2020, 4:20 PM  _________________________________________________________________   (provider comments below)

## 2020-12-05 ENCOUNTER — Inpatient Hospital Stay: Payer: Self-pay

## 2020-12-05 NOTE — Telephone Encounter (Signed)
° °  Primary Cardiologist: Kate Sable, MD  Chart reviewed as part of pre-operative protocol coverage. Given past medical history and time since last visit, based on ACC/AHA guidelines, Denise Alexander would be at acceptable risk for the planned procedure without further cardiovascular testing.   I will route this recommendation to the requesting party via Epic fax function and remove from pre-op pool.  Please call with questions.  Jossie Ng. Michaela Shankel NP-C    12/05/2020, 8:37 AM [address] Office [phone] Fax [phone]

## 2020-12-05 NOTE — Telephone Encounter (Signed)
Called patient to let her know that she was able to get cardiac clearance. Therefore, she is able to have her procedure as scheduled. Patient understood and had no further questions.

## 2020-12-06 LAB — IGG: IgG (Immunoglobin G), Serum: 1107 mg/dL (ref 586–1602)

## 2020-12-06 LAB — CERULOPLASMIN: Ceruloplasmin: 27.4 mg/dL (ref 19.0–39.0)

## 2020-12-06 LAB — MITOCHONDRIAL/SMOOTH MUSCLE AB PNL
Mitochondrial Ab: <20 Units (ref 0.0–20.0)
Smooth Muscle Ab: 13 Units (ref 0–19)

## 2020-12-06 LAB — HEPATITIS B CORE ANTIBODY, TOTAL: Hep B Core Total Ab: NEGATIVE

## 2020-12-06 LAB — TISSUE TRANSGLUTAMINASE ABS,IGG,IGA
Tissue Transglut Ab: 16 U/mL — ABNORMAL HIGH (ref 0–5)
Transglutaminase IgA: >100 U/mL — ABNORMAL HIGH (ref 0–3)

## 2020-12-06 LAB — ANTI-MICROSOMAL ANTIBODY LIVER / KIDNEY: LKM1 Ab: 1 Units (ref 0.0–20.0)

## 2020-12-06 LAB — HEPATITIS A ANTIBODY, TOTAL: hep A Total Ab: NEGATIVE

## 2020-12-06 LAB — ANA: ANA Titer 1: NEGATIVE

## 2020-12-06 LAB — HEPATITIS B SURFACE ANTIBODY,QUALITATIVE: Hep B Surface Ab, Qual: NONREACTIVE

## 2020-12-10 ENCOUNTER — Ambulatory Visit (INDEPENDENT_AMBULATORY_CARE_PROVIDER_SITE_OTHER): Payer: Self-pay | Admitting: Cardiology

## 2020-12-10 ENCOUNTER — Encounter: Payer: Self-pay | Admitting: Cardiology

## 2020-12-10 ENCOUNTER — Other Ambulatory Visit: Payer: Self-pay

## 2020-12-10 VITALS — BP 104/60 | HR 84 | Ht 62.0 in | Wt 107.2 lb

## 2020-12-10 DIAGNOSIS — I2510 Atherosclerotic heart disease of native coronary artery without angina pectoris: Secondary | ICD-10-CM

## 2020-12-10 DIAGNOSIS — E785 Hyperlipidemia, unspecified: Secondary | ICD-10-CM

## 2020-12-10 DIAGNOSIS — Q223 Other congenital malformations of pulmonary valve: Secondary | ICD-10-CM

## 2020-12-10 DIAGNOSIS — F1910 Other psychoactive substance abuse, uncomplicated: Secondary | ICD-10-CM

## 2020-12-10 NOTE — H&P (View-Only) (Signed)
Cardiology Office Note:    Date:  12/10/2020   ID:  Denise Alexander, DOB [birth year], MRN [id number]  PCP:  Patient, No Pcp Per  [place] Cardiologist:  Kate Sable, MD  [place] Electrophysiologist:  None   Referring MD: No ref. provider found   Chief Complaint  Patient presents with  . Other    Fu echo no complaints today. Meds reviewed verbally with pt.    History of Present Illness:    Denise Alexander is a 49 y.o. female with a hx of non obstructive NSTEMI/CAD ([place] 2018 -50% ost 1st diagonal), COPD, cocaine use, current smoker x 30+yrs who presents for follow-up. She was last seen to establish care due to history of NSTEMI.  She endorses using cocaine after last visit, still smokes. Imdur was started for antianginal benefit and also prevent vasospasm. Echocardiogram was obtained to evaluate cardiac function. She feels well, she is working on quitting smoking and also cocaine use. She takes all her medications as prescribed.  Prior notes Patient was evaluated in the ED in 2018 due to chest pains and elevated troponins, diagnosed with NSTEMI.  This occurred in the context of cocaine use. Left heart cath 2018 showed 50% stenosis of first diagonal (<58m vessel), rest of coronaries were normal.  Echocardiogram 11/2017 showed normal ejection fraction, EF 60%.  Mild MR.  Her symptoms were deemed likely from coronary vasospasm.     Past Medical History:  Diagnosis Date  . Anemia   . COPD [place]ch[place] pulmonary disease) ([place]   . Depression   . [place] ([place]ocardial infarction) ([facility]   . Substance abuse ([facility]     Past Surgical History:  Procedure Laterality Date  . LEFT HEART CATH AND CORONARY ANGIOGRAPHY Right 12/03/2017   Procedure: LEFT HEART CATH AND CORONARY ANGIOGRAPHY;  Surgeon: KDionisio David MD;  [facility];  Service: Cardiovascular;  Laterality: Right;  . TUBAL LIGATION      Current Medications: Current Meds  Medication Sig  .  aspirin EC 81 MG EC tablet Take 1 tablet (81 mg total) by mouth daily.  .Marland Kitchenatorvastatin (LIPITOR) 40 MG tablet Take 1 tablet (40 mg total) by mouth daily.  . Ferrous Sulfate (IRON) 90 (18 Fe) MG TABS Take 1 tablet by mouth every other day.  . Fluticasone-Salmeterol (ADVAIR) 100-50 MCG/DOSE AEPB Inhale 1 puff into the lungs in the morning and at bedtime.  . isosorbide mononitrate (IMDUR) 30 MG 24 hr tablet Take 0.5 tablets (15 mg total) by mouth daily.  . nitroGLYCERIN (NITROSTAT) 0.4 MG SL tablet Place 1 tablet (0.4 mg total) under the tongue every 5 (five) minutes as needed for chest pain.  .Marland Kitchenomeprazole (PRILOSEC) 40 MG capsule Take 1 capsule (40 mg total) by mouth daily.  . VENTOLIN HFA 108 (90 Base) MCG/ACT inhaler INHALE 2 PUFFS INTO THE LUNGS EVERY 6 HOURS AS NEEDED FOR WHEEZING OR SHORTNESS OF BREATH.     Allergies:   Penicillins   Social History   Socioeconomic History  . Marital status: Single    Spouse name: Not on file  . Number of children: Not on file  . Years of education: Not on file  . Highest education level: Not on file  Occupational History  . Not on file  Tobacco Use  . Smoking status: Current Every Day Smoker    Packs/day: 0.50    Types: Cigarettes  . Smokeless tobacco: Never Used  . Tobacco comment: Started smoking at age 49  Substance and Sexual Activity  . Alcohol use: No  . Drug use: Yes    Frequency: 1.0 times per week    Types: Marijuana    Comment: 1 joint/week  . Sexual activity: Not Currently  Other Topics Concern  . Not on file  Social History Narrative   Lives in [place]; works in group home; smoker; alcohol; hx of cocaine/marijuana [end of Nov 2021].    Social Determinants of Health   Financial Resource Strain: Not on file  Food Insecurity: Not on file  Transportation Needs: Not on file  Physical Activity: Not on file  Stress: Not on file  Social Connections: Not on file     Family History: The patient's family history includes  Colon cancer in her mother; Ovarian cancer in her mother; Skin cancer in her mother; Throat cancer in her mother.  ROS:   Please see the history of present illness.     All other systems reviewed and are negative.  EKGs/Labs/Other Studies Reviewed:    The following studies were reviewed today:   EKG:  EKG is  ordered today.  The ekg ordered today demonstrates sinus rhythm, normal ECG.  Recent Labs: 10/30/2020: TSH 4.430 11/21/2020: ALT 71; BUN 13; Creatinine, Ser 0.61; Hemoglobin 7.6; Platelets 254; Potassium 3.9; Sodium 139  Recent Lipid Panel    Component Value Date/Time   CHOL 133 10/30/2020 1007   TRIG 75 10/30/2020 1007   HDL 25 (L) 10/30/2020 1007   CHOLHDL 5.3 (H) 10/30/2020 1007   CHOLHDL 5.5 12/03/2017 0414   VLDL 18 12/03/2017 0414   LDLCALC 93 10/30/2020 1007     Risk Assessment/Calculations:      Physical Exam:    VS:  BP 104/60 (BP Location: Left Arm, Patient Position: Sitting, Cuff Size: Normal)   Pulse 84   Ht 5' 2"  (1.575 m)   Wt 107 lb 4 oz (48.6 kg)   LMP 11/28/2017 (LMP Unknown)   SpO2 99%   BMI 19.62 kg/m     Wt Readings from Last 3 Encounters:  12/10/20 107 lb 4 oz (48.6 kg)  12/04/20 108 lb 3.2 oz (49.1 kg)  11/21/20 110 lb (49.9 kg)     GEN:  Well nourished, well developed in no acute distress HEENT: Normal NECK: No JVD; No carotid bruits LYMPHATICS: No lymphadenopathy CARDIAC: RRR, no murmurs, rubs, gallops RESPIRATORY:  Clear to auscultation without rales, wheezing or rhonchi  ABDOMEN: Soft, non-tender, non-distended MUSCULOSKELETAL:  No edema; No deformity  SKIN: Warm and dry NEUROLOGIC:  Alert and oriented x 3 PSYCHIATRIC:  Normal affect   ASSESSMENT:    1. Coronary artery disease involving native coronary artery of native heart without angina pectoris   2. Abnormality of pulmonary valve   3. Hyperlipidemia LDL goal <70   4. Polysubstance abuse ([place])      PLAN:    In order of problems listed above:  1. Echogenic  abnormality noted on transthoracic echo around the pulmonic valve. Plan for TEE to evaluate any significant thrombus or vegetation in light of patient's history of polysubstance abuse. 2. History of nonobstructive CAD, 50% first diagonal stenosis. Denies chest pain. Continue aspirin, Lipitor, Imdur. Echo with normal systolic and diastolic function, trivial MR. Echogenic mobility noted on pulmonic valve. Get echocardiogram as above. 3. Hyperlipidemia, LDL goal less than 70,Lipitor 40 mg daily. 4. Patient is a current smoker, still uses cocaine, cessation advised.  Over 5 minutes spent counseling patient.    Follow-up in 6 months.  Shared Decision  Making/Informed Consent    The risks [esophageal damage, perforation (1:10,000 risk), bleeding, pharyngeal hematoma as well as other potential complications associated with conscious sedation including aspiration, arrhythmia, respiratory failure and death], benefits (treatment guidance and diagnostic support) and alternatives of a transesophageal echocardiogram were discussed in detail with Denise Alexander and she is willing to proceed.    Medication Adjustments/Labs and Tests Ordered: Current medicines are reviewed at length with the patient today.  Concerns regarding medicines are outlined above.  Orders Placed This Encounter  Procedures  . Basic metabolic panel  . CBC   No orders of the defined types were placed in this encounter.   Patient Instructions  Medication Instructions:   Your physician recommends that you continue on your current medications as directed. Please refer to the Current Medication list given to you today.  *If you need a refill on your cardiac medications before your next appointment, please call your pharmacy*   Lab Work: Your physician recommends that you return for lab work in: before you TEE procedure on 12/26/19  Please go get your Covid Screening in front of the medical mall entrance on:  Monday 11/22/20 between the  hours of 8am-1pm.  - Please go to the [facility]. You will check in at the front desk to the right as you walk into the atrium. Valet Parking is offered if needed. - No appointment needed. You may go any day between 7 am and 6 pm.   Testing/Procedures:  You are scheduled for a Cardioversion on ________________ with Dr.___________ Please arrive at the [place] [facility] at _________ a.m. on the day of your procedure.  DIET INSTRUCTIONS:  Nothing to eat or drink after midnight except your medications with a sip of water.         1) Labs: ____the week before procedure______________  2) Medications:  YOU MAY TAKE ALL of your remaining medications with a small amount of water.  3) Must have a responsible person to drive you home.  4) Bring a current list of your medications and current insurance cards.    If you have any questions after you get home, please call the office at [phone]   Follow-Up: At [facility], you and your health needs are our priority.  As part of our continuing mission to provide you with exceptional heart care, we have created designated Provider Care Teams.  These Care Teams include your primary Cardiologist (physician) and Advanced Practice Providers (APPs -  Physician Assistants and Nurse Practitioners) who all work together to provide you with the care you need, when you need it.  We recommend signing up for the patient portal called "MyChart".  Sign up information is provided on this After Visit Summary.  MyChart is used to connect with patients for Virtual Visits (Telemedicine).  Patients are able to view lab/test results, encounter notes, upcoming appointments, etc.  Non-urgent messages can be sent to your provider as well.   To learn more about what you can do with MyChart, go to [email or web address].    Your next appointment:   6 month(s)  The format for your next appointment:   In Person  Provider:   Kate Sable, MD   Other  Instructions      Signed, Kate Sable, MD  12/10/2020 10:19 AM    [place]

## 2020-12-10 NOTE — Patient Instructions (Addendum)
Medication Instructions:   Your physician recommends that you continue on your current medications as directed. Please refer to the Current Medication list given to you today.  *If you need a refill on your cardiac medications before your next appointment, please call your pharmacy*   Lab Work:  1.  Your physician recommends that you return for lab work in: before you TEE procedure on 12/26/19   - Please go to the [facility]. You will check in at the front desk to the right as you walk into the  atrium. Valet Parking is offered if needed.  -No appointment needed. You may go any day between 7 am and 6 pm.  2.  Please go get your Covid Screening in front of the medical mall entrance on: Mon. 11/22/20 between 8am-1pm.   Testing/Procedures:   You are scheduled for a Cardioversion on ____Wed 1/5/2022____________ with Dr._Agbor-Etang________ Please arrive at the [place] [facility] at __0700_______ a.m. on the day of your procedure.  DIET INSTRUCTIONS:  Nothing to eat or drink after midnight except your medications with a sip of water.         1) Labs: ____Day of Covid Testing______________  2) Medications:  YOU MAY TAKE ALL of your remaining medications with a small amount of water.  3) Must have a responsible person to drive you home.  4) Bring a current list of your medications and current insurance cards.    If you have any questions after you get home, please call the office at [phone]   Follow-Up: At [facility], you and your health needs are our priority.  As part of our continuing mission to provide you with exceptional heart care, we have created designated Provider Care Teams.  These Care Teams include your primary Cardiologist (physician) and Advanced Practice Providers (APPs -  Physician Assistants and Nurse Practitioners) who all work together to provide you with the care you need, when you need it.  We recommend signing up for the patient portal called  "MyChart".  Sign up information is provided on this After Visit Summary.  MyChart is used to connect with patients for Virtual Visits (Telemedicine).  Patients are able to view lab/test results, encounter notes, upcoming appointments, etc.  Non-urgent messages can be sent to your provider as well.   To learn more about what you can do with MyChart, go to [email or web address].    Your next appointment:    Tuesday 06/10/20 0900  The format for your next appointment:   In Person  Provider:   Kate Sable, MD   Other Instructions

## 2020-12-10 NOTE — Progress Notes (Signed)
Cardiology Office Note:    Date:  12/10/2020   ID:  Denise Alexander, DOB Oct 31, 1971, MRN [id number]  PCP:  Patient, No Pcp Per  [place] Cardiologist:  Kate Sable, MD  [place] Electrophysiologist:  None   Referring MD: No ref. provider found   Chief Complaint  Patient presents with  . Other    Fu echo no complaints today. Meds reviewed verbally with pt.    History of Present Illness:    Denise Alexander is a 49 y.o. female with a hx of non obstructive NSTEMI/CAD ([place] 2018 -50% ost 1st diagonal), COPD, cocaine use, current smoker x 30+yrs who presents for follow-up. She was last seen to establish care due to history of NSTEMI.  She endorses using cocaine after last visit, still smokes. Imdur was started for antianginal benefit and also prevent vasospasm. Echocardiogram was obtained to evaluate cardiac function. She feels well, she is working on quitting smoking and also cocaine use. She takes all her medications as prescribed.  Prior notes Patient was evaluated in the ED in 2018 due to chest pains and elevated troponins, diagnosed with NSTEMI.  This occurred in the context of cocaine use. Left heart cath 2018 showed 50% stenosis of first diagonal (<48m vessel), rest of coronaries were normal.  Echocardiogram 11/2017 showed normal ejection fraction, EF 60%.  Mild MR.  Her symptoms were deemed likely from coronary vasospasm.     Past Medical History:  Diagnosis Date  . Anemia   . COPD [place] obstructive pulmonary disease) ([place]   . Depression   . [place]ocardial infarction) ([place]   . Substance abuse ([facility]     Past Surgical History:  Procedure Laterality Date  . LEFT HEART CATH AND CORONARY ANGIOGRAPHY Right 12/03/2017   Procedure: LEFT HEART CATH AND CORONARY ANGIOGRAPHY;  Surgeon: KDionisio David MD;  [facility];  Service: Cardiovascular;  Laterality: Right;  . TUBAL LIGATION      Current Medications: Current Meds  Medication Sig  .  aspirin EC 81 MG EC tablet Take 1 tablet (81 mg total) by mouth daily.  .Marland Kitchenatorvastatin (LIPITOR) 40 MG tablet Take 1 tablet (40 mg total) by mouth daily.  . Ferrous Sulfate (IRON) 90 (18 Fe) MG TABS Take 1 tablet by mouth every other day.  . Fluticasone-Salmeterol (ADVAIR) 100-50 MCG/DOSE AEPB Inhale 1 puff into the lungs in the morning and at bedtime.  . isosorbide mononitrate (IMDUR) 30 MG 24 hr tablet Take 0.5 tablets (15 mg total) by mouth daily.  . nitroGLYCERIN (NITROSTAT) 0.4 MG SL tablet Place 1 tablet (0.4 mg total) under the tongue every 5 (five) minutes as needed for chest pain.  .Marland Kitchenomeprazole (PRILOSEC) 40 MG capsule Take 1 capsule (40 mg total) by mouth daily.  . VENTOLIN HFA 108 (90 Base) MCG/ACT inhaler INHALE 2 PUFFS INTO THE LUNGS EVERY 6 HOURS AS NEEDED FOR WHEEZING OR SHORTNESS OF BREATH.     Allergies:   Penicillins   Social History   Socioeconomic History  . Marital status: Single    Spouse name: Not on file  . Number of children: Not on file  . Years of education: Not on file  . Highest education level: Not on file  Occupational History  . Not on file  Tobacco Use  . Smoking status: Current Every Day Smoker    Packs/day: 0.50    Types: Cigarettes  . Smokeless tobacco: Never Used  . Tobacco comment: Started smoking at age 49  Substance and Sexual Activity  . Alcohol use: No  . Drug use: Yes    Frequency: 1.0 times per week    Types: Marijuana    Comment: 1 joint/week  . Sexual activity: Not Currently  Other Topics Concern  . Not on file  Social History Narrative   Lives in [place]; works in group home; smoker; alcohol; hx of cocaine/marijuana [end of Nov 2021].    Social Determinants of Health   Financial Resource Strain: Not on file  Food Insecurity: Not on file  Transportation Needs: Not on file  Physical Activity: Not on file  Stress: Not on file  Social Connections: Not on file     Family History: The patient's family history includes  Colon cancer in her mother; Ovarian cancer in her mother; Skin cancer in her mother; Throat cancer in her mother.  ROS:   Please see the history of present illness.     All other systems reviewed and are negative.  EKGs/Labs/Other Studies Reviewed:    The following studies were reviewed today:   EKG:  EKG is  ordered today.  The ekg ordered today demonstrates sinus rhythm, normal ECG.  Recent Labs: 10/30/2020: TSH 4.430 11/21/2020: ALT 71; BUN 13; Creatinine, Ser 0.61; Hemoglobin 7.6; Platelets 254; Potassium 3.9; Sodium 139  Recent Lipid Panel    Component Value Date/Time   CHOL 133 10/30/2020 1007   TRIG 75 10/30/2020 1007   HDL 25 (L) 10/30/2020 1007   CHOLHDL 5.3 (H) 10/30/2020 1007   CHOLHDL 5.5 12/03/2017 0414   VLDL 18 12/03/2017 0414   LDLCALC 93 10/30/2020 1007     Risk Assessment/Calculations:      Physical Exam:    VS:  BP 104/60 (BP Location: Left Arm, Patient Position: Sitting, Cuff Size: Normal)   Pulse 84   Ht 5' 2"  (1.575 m)   Wt 107 lb 4 oz (48.6 kg)   LMP 11/28/2017 (LMP Unknown)   SpO2 99%   BMI 19.62 kg/m     Wt Readings from Last 3 Encounters:  12/10/20 107 lb 4 oz (48.6 kg)  12/04/20 108 lb 3.2 oz (49.1 kg)  11/21/20 110 lb (49.9 kg)     GEN:  Well nourished, well developed in no acute distress HEENT: Normal NECK: No JVD; No carotid bruits LYMPHATICS: No lymphadenopathy CARDIAC: RRR, no murmurs, rubs, gallops RESPIRATORY:  Clear to auscultation without rales, wheezing or rhonchi  ABDOMEN: Soft, non-tender, non-distended MUSCULOSKELETAL:  No edema; No deformity  SKIN: Warm and dry NEUROLOGIC:  Alert and oriented x 3 PSYCHIATRIC:  Normal affect   ASSESSMENT:    1. Coronary artery disease involving native coronary artery of native heart without angina pectoris   2. Abnormality of pulmonary valve   3. Hyperlipidemia LDL goal <70   4. Polysubstance abuse ([place])      PLAN:    In order of problems listed above:  1. Echogenic  abnormality noted on transthoracic echo around the pulmonic valve. Plan for TEE to evaluate any significant thrombus or vegetation in light of patient's history of polysubstance abuse. 2. History of nonobstructive CAD, 50% first diagonal stenosis. Denies chest pain. Continue aspirin, Lipitor, Imdur. Echo with normal systolic and diastolic function, trivial MR. Echogenic mobility noted on pulmonic valve. Get echocardiogram as above. 3. Hyperlipidemia, LDL goal less than 70,Lipitor 40 mg daily. 4. Patient is a current smoker, still uses cocaine, cessation advised.  Over 5 minutes spent counseling patient.    Follow-up in 6 months.  Shared Decision  Making/Informed Consent    The risks [esophageal damage, perforation (1:10,000 risk), bleeding, pharyngeal hematoma as well as other potential complications associated with conscious sedation including aspiration, arrhythmia, respiratory failure and death], benefits (treatment guidance and diagnostic support) and alternatives of a transesophageal echocardiogram were discussed in detail with Denise Alexander and she is willing to proceed.    Medication Adjustments/Labs and Tests Ordered: Current medicines are reviewed at length with the patient today.  Concerns regarding medicines are outlined above.  Orders Placed This Encounter  Procedures  . Basic metabolic panel  . CBC   No orders of the defined types were placed in this encounter.   Patient Instructions  Medication Instructions:   Your physician recommends that you continue on your current medications as directed. Please refer to the Current Medication list given to you today.  *If you need a refill on your cardiac medications before your next appointment, please call your pharmacy*   Lab Work: Your physician recommends that you return for lab work in: before you TEE procedure on 12/26/19  Please go get your Covid Screening in front of the medical mall entrance on:  Monday 11/22/20 between the  hours of 8am-1pm.  - Please go to the [facility]. You will check in at the front desk to the right as you walk into the atrium. Valet Parking is offered if needed. - No appointment needed. You may go any day between 7 am and 6 pm.   Testing/Procedures:  You are scheduled for a Cardioversion on ________________ with Dr.___________ Please arrive at the [place] [facility] at _________ a.m. on the day of your procedure.  DIET INSTRUCTIONS:  Nothing to eat or drink after midnight except your medications with a sip of water.         1) Labs: ____the week before procedure______________  2) Medications:  YOU MAY TAKE ALL of your remaining medications with a small amount of water.  3) Must have a responsible person to drive you home.  4) Bring a current list of your medications and current insurance cards.    If you have any questions after you get home, please call the office at [phone]   Follow-Up: At [facility], you and your health needs are our priority.  As part of our continuing mission to provide you with exceptional heart care, we have created designated Provider Care Teams.  These Care Teams include your primary Cardiologist (physician) and Advanced Practice Providers (APPs -  Physician Assistants and Nurse Practitioners) who all work together to provide you with the care you need, when you need it.  We recommend signing up for the patient portal called "MyChart".  Sign up information is provided on this After Visit Summary.  MyChart is used to connect with patients for Virtual Visits (Telemedicine).  Patients are able to view lab/test results, encounter notes, upcoming appointments, etc.  Non-urgent messages can be sent to your provider as well.   To learn more about what you can do with MyChart, go to [email or web address].    Your next appointment:   6 month(s)  The format for your next appointment:   In Person  Provider:   Kate Sable, MD   Other  Instructions      Signed, Kate Sable, MD  12/10/2020 10:19 AM    [place]

## 2020-12-11 ENCOUNTER — Inpatient Hospital Stay: Payer: Self-pay

## 2020-12-11 ENCOUNTER — Telehealth: Payer: Self-pay

## 2020-12-11 VITALS — BP 126/81 | HR 69 | Temp 96.0°F | Resp 18

## 2020-12-11 DIAGNOSIS — D649 Anemia, unspecified: Secondary | ICD-10-CM

## 2020-12-11 MED ORDER — SODIUM CHLORIDE 0.9 % IV SOLN
Freq: Once | INTRAVENOUS | Status: AC
  Filled 2020-12-11: qty 250

## 2020-12-11 MED ORDER — SODIUM CHLORIDE 0.9 % IV SOLN
510.0000 mg | Freq: Once | INTRAVENOUS | Status: AC
  Administered 2020-12-11: 14:00:00 510 mg via INTRAVENOUS
  Filled 2020-12-11: qty 510

## 2020-12-11 NOTE — Progress Notes (Signed)
Pt tolerated Feraheme infusion well with no signs of complications. VSS. Pt stable for discharge. RN educated pt on the importance of notifying the clinic if any complications occur at home, pt verbalized understanding and all questions answered at this time.   Mckala Pantaleon [employer]

## 2020-12-11 NOTE — Telephone Encounter (Signed)
-----   Message from Virgel Manifold, MD sent at 12/09/2020 10:12 AM EST ----- Herb Grays please let the patient know, her blood work does show evidence of celiac disease.  We will obtain biopsies during her procedure to confirm this as well.

## 2020-12-11 NOTE — Telephone Encounter (Signed)
Called patient to let her know the below information and she understood had no further questions. Patient was reminded that her procedure was on 01/01/21 and she stated that she remembered.

## 2020-12-17 NOTE — Addendum Note (Signed)
Addended by: Raelene Bott, Chele Cornell L on: 12/17/2020 04:51 PM   Modules accepted: Orders

## 2020-12-19 ENCOUNTER — Ambulatory Visit
Admission: RE | Admit: 2020-12-19 | Discharge: 2020-12-19 | Disposition: A | Discharge status: Home / Self Care | Payer: Self-pay | Source: Ambulatory Visit | Attending: Gastroenterology | Admitting: Gastroenterology

## 2020-12-19 ENCOUNTER — Other Ambulatory Visit: Payer: Self-pay

## 2020-12-19 DIAGNOSIS — R109 Unspecified abdominal pain: Secondary | ICD-10-CM | POA: Insufficient documentation

## 2020-12-19 MED ORDER — IOHEXOL 350 MG/ML SOLN
80.0000 mL | Freq: Once | INTRAVENOUS | Status: AC | PRN
  Administered 2020-12-19: 16:00:00 80 mL via INTRAVENOUS

## 2020-12-23 ENCOUNTER — Other Ambulatory Visit
Admission: RE | Admit: 2020-12-23 | Discharge: 2020-12-23 | Disposition: A | Discharge status: Home / Self Care | Payer: Medicaid Other | Source: Ambulatory Visit | Attending: Cardiology | Admitting: Cardiology

## 2020-12-23 DIAGNOSIS — Q223 Other congenital malformations of pulmonary valve: Secondary | ICD-10-CM | POA: Insufficient documentation

## 2020-12-23 DIAGNOSIS — Z01812 Encounter for preprocedural laboratory examination: Secondary | ICD-10-CM | POA: Insufficient documentation

## 2020-12-23 DIAGNOSIS — Z20822 Contact with and (suspected) exposure to covid-19: Secondary | ICD-10-CM | POA: Diagnosis not present

## 2020-12-23 DIAGNOSIS — I2510 Atherosclerotic heart disease of native coronary artery without angina pectoris: Secondary | ICD-10-CM

## 2020-12-23 LAB — BASIC METABOLIC PANEL
Anion gap: 7 (ref 5–15)
BUN: 12 mg/dL (ref 6–20)
CO2: 25 mmol/L (ref 22–32)
Calcium: 8.8 mg/dL — ABNORMAL LOW (ref 8.9–10.3)
Chloride: 106 mmol/L (ref 98–111)
Creatinine, Ser: 0.72 mg/dL (ref 0.44–1.00)
GFR, Estimated: >60 mL/min (ref >60)
Glucose, Bld: 88 mg/dL (ref 70–99)
Potassium: 4.7 mmol/L (ref 3.5–5.1)
Sodium: 138 mmol/L (ref 135–145)

## 2020-12-23 LAB — CBC
HCT: 41.9 % (ref 36.0–46.0)
Hemoglobin: 12.3 g/dL (ref 12.0–15.0)
MCH: 22.8 pg — ABNORMAL LOW (ref 26.0–34.0)
MCHC: 29.4 g/dL — ABNORMAL LOW (ref 30.0–36.0)
MCV: 77.6 fL — ABNORMAL LOW (ref 80.0–100.0)
Platelets: 195 10*3/uL (ref 150–400)
RBC: 5.4 MIL/uL — ABNORMAL HIGH (ref 3.87–5.11)
WBC: 6.6 10*3/uL (ref 4.0–10.5)
nRBC: 0 % (ref 0.0–0.2)

## 2020-12-24 LAB — SARS CORONAVIRUS 2 (TAT 6-24 HRS): SARS Coronavirus 2: NEGATIVE

## 2020-12-25 ENCOUNTER — Other Ambulatory Visit: Payer: Self-pay

## 2020-12-25 ENCOUNTER — Telehealth: Payer: Self-pay

## 2020-12-25 ENCOUNTER — Encounter: Payer: Self-pay | Admitting: Cardiology

## 2020-12-25 ENCOUNTER — Ambulatory Visit
Admission: RE | Admit: 2020-12-25 | Discharge: 2020-12-25 | Disposition: A | Discharge status: Home / Self Care | Payer: Self-pay | Attending: Cardiology | Admitting: Cardiology

## 2020-12-25 ENCOUNTER — Ambulatory Visit (HOSPITAL_BASED_OUTPATIENT_CLINIC_OR_DEPARTMENT_OTHER)
Admission: RE | Admit: 2020-12-25 | Discharge: 2020-12-25 | Disposition: A | Discharge status: Home / Self Care | Payer: Self-pay | Source: Home / Self Care | Attending: Cardiology | Admitting: Cardiology

## 2020-12-25 ENCOUNTER — Encounter
Admission: RE | Disposition: A | Discharge status: Home / Self Care | Payer: Medicaid Other | Source: Home / Self Care | Attending: Cardiology

## 2020-12-25 DIAGNOSIS — J449 Chronic obstructive pulmonary disease, unspecified: Secondary | ICD-10-CM | POA: Insufficient documentation

## 2020-12-25 DIAGNOSIS — I252 Old myocardial infarction: Secondary | ICD-10-CM | POA: Insufficient documentation

## 2020-12-25 DIAGNOSIS — Z7951 Long term (current) use of inhaled steroids: Secondary | ICD-10-CM | POA: Insufficient documentation

## 2020-12-25 DIAGNOSIS — F17210 Nicotine dependence, cigarettes, uncomplicated: Secondary | ICD-10-CM | POA: Insufficient documentation

## 2020-12-25 DIAGNOSIS — Z7982 Long term (current) use of aspirin: Secondary | ICD-10-CM | POA: Insufficient documentation

## 2020-12-25 DIAGNOSIS — F1490 Cocaine use, unspecified, uncomplicated: Secondary | ICD-10-CM | POA: Insufficient documentation

## 2020-12-25 DIAGNOSIS — Z79899 Other long term (current) drug therapy: Secondary | ICD-10-CM | POA: Insufficient documentation

## 2020-12-25 DIAGNOSIS — I2510 Atherosclerotic heart disease of native coronary artery without angina pectoris: Secondary | ICD-10-CM

## 2020-12-25 DIAGNOSIS — I379 Nonrheumatic pulmonary valve disorder, unspecified: Secondary | ICD-10-CM

## 2020-12-25 DIAGNOSIS — E785 Hyperlipidemia, unspecified: Secondary | ICD-10-CM | POA: Insufficient documentation

## 2020-12-25 DIAGNOSIS — Z880 Allergy status to penicillin: Secondary | ICD-10-CM | POA: Insufficient documentation

## 2020-12-25 DIAGNOSIS — Q223 Other congenital malformations of pulmonary valve: Secondary | ICD-10-CM

## 2020-12-25 HISTORY — PX: TEE WITHOUT CARDIOVERSION: SHX5443

## 2020-12-25 SURGERY — ECHOCARDIOGRAM, TRANSESOPHAGEAL
Anesthesia: Moderate Sedation

## 2020-12-25 MED ORDER — FENTANYL CITRATE (PF) 100 MCG/2ML IJ SOLN
INTRAMUSCULAR | Status: AC | PRN
  Administered 2020-12-25 (×3): 25 ug via INTRAVENOUS

## 2020-12-25 MED ORDER — MIDAZOLAM HCL 2 MG/2ML IJ SOLN
INTRAMUSCULAR | Status: AC | PRN
  Administered 2020-12-25 (×3): 1 mg via INTRAVENOUS

## 2020-12-25 MED ORDER — LIDOCAINE VISCOUS HCL 2 % MT SOLN
OROMUCOSAL | Status: AC
  Administered 2020-12-25: 15 mL
  Filled 2020-12-25: qty 15

## 2020-12-25 MED ORDER — SODIUM CHLORIDE 0.9 % IV SOLN: INTRAVENOUS | Status: DC

## 2020-12-25 MED ORDER — MIDAZOLAM HCL 5 MG/5ML IJ SOLN
INTRAMUSCULAR | Status: AC
  Filled 2020-12-25: qty 5

## 2020-12-25 MED ORDER — BUTAMBEN-TETRACAINE-BENZOCAINE 2-2-14 % EX AERO
INHALATION_SPRAY | CUTANEOUS | Status: AC
  Filled 2020-12-25: qty 5

## 2020-12-25 MED ORDER — FENTANYL CITRATE (PF) 100 MCG/2ML IJ SOLN
INTRAMUSCULAR | Status: AC
  Filled 2020-12-25: qty 2

## 2020-12-25 MED ORDER — SODIUM CHLORIDE 0.9 % IV SOLN
INTRAVENOUS | Status: AC | PRN
  Administered 2020-12-25: 500 mL via INTRAVENOUS

## 2020-12-25 MED ORDER — SODIUM CHLORIDE FLUSH 0.9 % IV SOLN
INTRAVENOUS | Status: AC
  Filled 2020-12-25: qty 10

## 2020-12-25 NOTE — Discharge Instructions (Signed)
Transesophageal Echocardiogram  Transesophageal echocardiogram (TEE) is a test that uses sound waves (echocardiogram) to produce very clear, detailed images of the heart. TEE is done by passing a flexible tube down the esophagus. The heart is located in front of the esophagus. TEE may be done:  To check how well your heart valves are working.  To check for any abnormal growth or tumor  To look for blood clots  To check for infection of the lining of the heart (endocarditis).  To evaluate the dividing wall (septum) of the heart and check for a hole that did not close after birth (patent foramen ovale or atrial septal defect).  To help diagnose a tear in the wall of the blood vessels (aortic dissection).  To look at the heart shape, size, and function. Any changes can be associated with certain conditions, including heart failure, aneurysm, and coronary heart disease, CAD.  During cardiac valve surgery. This allows the surgeon to assess the valve repair before closing the chest.  During a variety of other cardiac procedures to guide positioning of catheters.  To monitor your heart's response to IV fluids or medicine. TEE is usually not a painful procedure. You may feel the probe press against the back of the throat. The probe does not enter the trachea and does not affect your breathing. Tell a health care provider about:  Any allergies you have.  All medicines you are taking, including vitamins, herbs, eye drops, creams, and over-the-counter medicines.  Any problems you or family members have had with anesthetic medicines.  Any blood disorders you have.  Any surgeries you have had.  Any medical conditions you have.  Any swallowing difficulties.  Whether you have or have had a blockage of the esophagus (esophageal obstruction).  Whether you are pregnant or may be pregnant. What are the risks? Generally, this is a safe procedure. However, problems may occur,  including:  Damage to other structures or organs.  A tear of the esophagus (esophageal rupture).  Irregular heart beat (arrhythmia).  Hoarse voice or difficulty swallowing.  Bleeding (hemorrhage). What happens before the procedure? Staying hydrated Follow instructions from your health care provider about hydration, which may include:  Up to 3 hours before the procedure - you may continue to drink clear liquids, such as water, clear fruit juice, black coffee, and plain tea. Eating and drinking Follow instructions from your health care provider about eating and drinking, which may include:  8 hours before the procedure - stop eating heavy meals or foods such as meat, fried foods, or fatty foods.  6 hours before the procedure - stop eating light meals or foods, such as toast or cereal.  6 hours before the procedure - stop drinking milk or drinks that contain milk.  3 hours before the procedure - stop drinking clear liquids. General instructions  You will need to remove any dentures or dental retainers.  Plan to have someone take you home from the hospital or clinic.  Plan to have a responsible adult care for you for at least 24 hours after you leave the hospital or clinic. This is important.  Ask your health care provider about: ? Changing or stopping your normal medicines. This is important if you take diabetes medicines or blood thinners. ? Taking over-the-counter medicines, vitamins, herbs, and supplements. ? Taking medicines such as aspirin and ibuprofen. These medicines can thin your blood. Do not take these medicines unless your health care provider tells you to take them. What happens during  the procedure?  To reduce your risk of infection: ? Your health care team will wash or sanitize their hands. ? Hair may be removed from the surgical area. ? Your skin will be washed with soap.  An IV will be inserted into one of your veins.  You will be given one or both of the  following: ? A medicine to help you relax (sedative). ? A medicine to be sprayed or gargled in order to numb the back of your throat (local anesthetic).  Your blood pressure, heart rate, and breathing (vital signs) will be monitored during the procedure.  You may be asked to lay on your left side.  A bite block will be placed in your mouth to keep you from biting the tube during the procedure.  The tip of the TEE probe will be placed into the back of your mouth. You will be asked to swallow. This helps to pass the tip of the probe into the esophagus.  Once the tip of the probe is in the correct area, your health care provider will take pictures of the heart.  The probe and bite block will be removed when the procedure is done. The procedure may vary among health care providers and hospitals What happens after the procedure?  Your blood pressure, heart rate, breathing rate, and blood oxygen level will be monitored until the medicines you were given have worn off.  When you first wake up, your throat may feel slightly sore and will probably still feel numb. This will improve slowly over time. You will not be allowed to eat or drink until the numbness has gone away.  Do not drive for 24 hours if you received a sedative. Summary  Transesophageal echocardiogram (TEE) is a test that uses sound waves (echocardiogram) to produce very clear, detailed images of the heart.  TEE is done by passing a flexible tube down the esophagus.  Generally, this is a safe procedure. However, problems may occur, including damage to other structures or organs, bleeding, irregular heart beat, and a hoarse voice or trouble swallowing.  Tell your health care provider about any medicines and medical conditions you may have, as some conditions may increase your risk of complications. This information is not intended to replace advice given to you by your health care provider. Make sure you discuss any questions you  have with your health care provider. Document Revised: 05/18/2017 Document Reviewed: 03/05/2017 Elsevier Patient Education  [place]. Moderate Conscious Sedation, Adult, Care After These instructions provide you with information about caring for yourself after your procedure. Your health care provider may also give you more specific instructions. Your treatment has been planned according to current medical practices, but problems sometimes occur. Call your health care provider if you have any problems or questions after your procedure. What can I expect after the procedure? After your procedure, it is common:  To feel sleepy for several hours.  To feel clumsy and have poor balance for several hours.  To have poor judgment for several hours.  To vomit if you eat too soon. Follow these instructions at home: For at least 24 hours after the procedure:   Do not: ? Participate in activities where you could fall or become injured. ? Drive. ? Use heavy machinery. ? Drink alcohol. ? Take sleeping pills or medicines that cause drowsiness. ? Make important decisions or sign legal documents. ? Take care of children on your own.  Rest. Eating and drinking  Follow the  diet recommended by your health care provider.  If you vomit: ? Drink water, juice, or soup when you can drink without vomiting. ? Make sure you have little or no nausea before eating solid foods. General instructions  Have a responsible adult stay with you until you are awake and alert.  Take over-the-counter and prescription medicines only as told by your health care provider.  If you smoke, do not smoke without supervision.  Keep all follow-up visits as told by your health care provider. This is important. Contact a health care provider if:  You keep feeling nauseous or you keep vomiting.  You feel light-headed.  You develop a rash.  You have a fever. Get help right away if:  You have trouble  breathing. This information is not intended to replace advice given to you by your health care provider. Make sure you discuss any questions you have with your health care provider. Document Revised: 11/19/2017 Document Reviewed: 03/28/2016 Elsevier Patient Education  2020 [employer].

## 2020-12-25 NOTE — Telephone Encounter (Signed)
-----   Message from Virgel Manifold, MD sent at 12/24/2020  1:22 PM EST ----- Herb Grays please let the patient know, her CT did not show any evidence of ischemia to be causing her abdominal pain.  Proceed with procedures as scheduled.  However, Please forward the CT results to her PCP and ask her to follow-up with her PCP in order to obtain nephrology consult as the CT showed renal artery narrowing.  This may not need further intervention, but needs to be evaluated by nephrologist

## 2020-12-25 NOTE — Telephone Encounter (Signed)
Called patient but was not able to leave her a voicemail since it was full. However, I ws able to leave her a MSM with our phone number to return our call. I will send her PCP the CT scan results and let her know if she could please refer patient to see the nephrologist for renal artery narrowing.

## 2020-12-25 NOTE — Progress Notes (Signed)
*  PRELIMINARY RESULTS* Echocardiogram Echocardiogram Transesophageal has been performed.  Sherrie Sport 12/25/2020, 9:43 AM

## 2020-12-25 NOTE — Interval H&P Note (Signed)
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  The various methods of treatment have been discussed with the patient and family. After consideration of risks, benefits and other options for treatment, the patient has consented to  Procedure(s): TRANSESOPHAGEAL ECHOCARDIOGRAM (TEE) (N/A) as a surgical intervention.  The patient's history has been reviewed, patient examined, no change in status, stable for surgery.  I have reviewed the patient's chart and labs.  Questions were answered to the patient's satisfaction.     Aaron Edelman [place]

## 2020-12-25 NOTE — Procedures (Signed)
Transesophageal Echocardiogram :  Indication: pulmonary valve abnormality  Procedure: 10cc of viscous lidocaine were given orally to provide local anesthesia to the oropharynx. The patient was positioned supine on the left side, bite block provided. The patient was moderately sedated with the doses of versed and fentanyl as detailed below.  Using digital technique an omniplane probe was advanced into the esophagus without incident.   Moderate sedation: 1. Sedation used:  Versed: 60m, Fentanyl: 766m 2. Time administered:   8:35  Time when patient started recovery: 8:50 3. I was face to face during this time 15 mins  See report in EPIC  for complete details: In brief, imaging revealed normal LV function with no RWMAs and no mural apical thrombus.  .  Estimated ejection fraction was 55%.  Right sided cardiac chambers were normal with no evidence of pulmonary hypertension.  Pulmonary valve appears normal with no masses or abnormalities  Imaging of the septum showed no ASD or VSD 2D and color flow confirmed no PFO  The LA was well visualized in orthogonal views.  There was no spontaneous contrast and no thrombus in the LA   The descending thoracic aorta had no  mural aortic debris with no evidence of aneurysmal dilation or disection   BrKate Sable/04/2021 8:58 AM

## 2020-12-30 ENCOUNTER — Other Ambulatory Visit: Payer: Self-pay

## 2020-12-30 ENCOUNTER — Other Ambulatory Visit
Admission: RE | Admit: 2020-12-30 | Discharge: 2020-12-30 | Disposition: A | Discharge status: Home / Self Care | Payer: Medicaid Other | Source: Ambulatory Visit | Attending: Gastroenterology | Admitting: Gastroenterology

## 2020-12-30 DIAGNOSIS — Z20822 Contact with and (suspected) exposure to covid-19: Secondary | ICD-10-CM | POA: Diagnosis not present

## 2020-12-30 DIAGNOSIS — Z01812 Encounter for preprocedural laboratory examination: Secondary | ICD-10-CM | POA: Insufficient documentation

## 2020-12-30 LAB — SARS CORONAVIRUS 2 (TAT 6-24 HRS): SARS Coronavirus 2: NEGATIVE

## 2020-12-30 NOTE — Telephone Encounter (Signed)
Called patient and was not able to leave her a voicemail. However, her PCP received our fax and they stated that they will contact the patient.

## 2021-01-01 ENCOUNTER — Ambulatory Visit
Admission: RE | Admit: 2021-01-01 | Discharge: 2021-01-01 | Disposition: A | Discharge status: Home / Self Care | Payer: Medicaid Other | Attending: Gastroenterology | Admitting: Gastroenterology

## 2021-01-01 ENCOUNTER — Encounter
Admission: RE | Disposition: A | Discharge status: Home / Self Care | Payer: Self-pay | Source: Home / Self Care | Attending: Gastroenterology

## 2021-01-01 ENCOUNTER — Encounter: Payer: Self-pay | Admitting: Gastroenterology

## 2021-01-01 ENCOUNTER — Encounter: Payer: Self-pay | Admitting: Anesthesiology

## 2021-01-01 ENCOUNTER — Other Ambulatory Visit: Payer: Self-pay

## 2021-01-01 DIAGNOSIS — Z538 Procedure and treatment not carried out for other reasons: Secondary | ICD-10-CM | POA: Insufficient documentation

## 2021-01-01 DIAGNOSIS — Z1211 Encounter for screening for malignant neoplasm of colon: Secondary | ICD-10-CM

## 2021-01-01 DIAGNOSIS — D509 Iron deficiency anemia, unspecified: Secondary | ICD-10-CM | POA: Insufficient documentation

## 2021-01-01 LAB — URINE DRUG SCREEN, QUALITATIVE (ARMC ONLY)
Amphetamines, Ur Screen: NOT DETECTED
Barbiturates, Ur Screen: NOT DETECTED
Benzodiazepine, Ur Scrn: NOT DETECTED
Cannabinoid 50 Ng, Ur ~~LOC~~: NOT DETECTED
Cocaine Metabolite,Ur ~~LOC~~: POSITIVE — AB
MDMA (Ecstasy)Ur Screen: NOT DETECTED
Methadone Scn, Ur: NOT DETECTED
Opiate, Ur Screen: NOT DETECTED
Phencyclidine (PCP) Ur S: NOT DETECTED
Tricyclic, Ur Screen: NOT DETECTED

## 2021-01-01 SURGERY — COLONOSCOPY WITH PROPOFOL
Anesthesia: General

## 2021-01-01 MED ORDER — LIDOCAINE HCL (PF) 2 % IJ SOLN
INTRAMUSCULAR | Status: AC
  Filled 2021-01-01: qty 5

## 2021-01-01 MED ORDER — SODIUM CHLORIDE 0.9 % IV SOLN: INTRAVENOUS | Status: DC

## 2021-01-01 MED ORDER — PROPOFOL 500 MG/50ML IV EMUL
INTRAVENOUS | Status: AC
  Filled 2021-01-01: qty 50

## 2021-01-01 NOTE — OR Nursing (Signed)
UDS came back positive, Dr. Michele Mcalpine office will reach out to pt to reschedule.  Dr. Amie Critchley spoke with pt, todays procedures are canceled due to positive UDS. Pt left unit ambulatory.

## 2021-01-01 NOTE — OR Nursing (Signed)
Patient reports she took crack on 12/28/2020.

## 2021-01-02 ENCOUNTER — Other Ambulatory Visit: Payer: Self-pay

## 2021-01-02 ENCOUNTER — Ambulatory Visit: Payer: Medicaid Other | Admitting: Gerontology

## 2021-01-02 ENCOUNTER — Encounter: Payer: Self-pay | Admitting: Gerontology

## 2021-01-02 VITALS — BP 126/91 | HR 80 | Temp 91.9°F | Resp 16 | Wt 107.6 lb

## 2021-01-02 DIAGNOSIS — F1910 Other psychoactive substance abuse, uncomplicated: Secondary | ICD-10-CM

## 2021-01-02 DIAGNOSIS — D509 Iron deficiency anemia, unspecified: Secondary | ICD-10-CM

## 2021-01-02 DIAGNOSIS — J439 Emphysema, unspecified: Secondary | ICD-10-CM

## 2021-01-02 DIAGNOSIS — K219 Gastro-esophageal reflux disease without esophagitis: Secondary | ICD-10-CM

## 2021-01-02 DIAGNOSIS — R935 Abnormal findings on diagnostic imaging of other abdominal regions, including retroperitoneum: Secondary | ICD-10-CM | POA: Insufficient documentation

## 2021-01-02 MED ORDER — IRON 90 (18 FE) MG PO TABS: 1.0000 | ORAL_TABLET | ORAL | 3 refills | Status: AC

## 2021-01-02 MED ORDER — FLUTICASONE-SALMETEROL 100-50 MCG/DOSE IN AEPB: 1.0000 | INHALATION_SPRAY | Freq: Two times a day (BID) | RESPIRATORY_TRACT | 2 refills | Status: DC

## 2021-01-02 MED ORDER — OMEPRAZOLE 40 MG PO CPDR: 40.0000 mg | DELAYED_RELEASE_CAPSULE | Freq: Every day | ORAL | 2 refills | Status: DC

## 2021-01-02 MED ORDER — ASPIRIN 81 MG PO TBEC: 81.0000 mg | DELAYED_RELEASE_TABLET | Freq: Every day | ORAL | 3 refills | Status: DC

## 2021-01-02 NOTE — Patient Instructions (Signed)

## 2021-01-02 NOTE — Progress Notes (Signed)
Established Patient Office Visit  Subjective:  Patient ID: Denise Alexander, female    DOB: December 18, 1971  Age: 50 y.o. MRN: [id number]  CC: No chief complaint on file.   HPI Denise Alexander presents for follow up visit and medication refill. She has a history of iron deficiency anemia, NSTEMI/CAD, COPD and cocaine use. Since her last visit on 11/07/20, she was seen by Hematologist Dr West Bali on 11/21/2020 for Iron deficiency anemia and Iron infusion was recommended. She states that she has being receiving iron infusion. She was equally seen by Dr Bonna Gains V.B on 12/04/20 for abdominal pain and elevated liver enzyme and EGD and Colonoscopy was recommended. 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 She denies chest pain, palpitation, light headedness, and abdominal pain. She reports that she's working on cutting back on smoking and using cocaine. Overall, she states that she's doing well and offers no further complaint.   Past Medical History:  Diagnosis Date  . Anemia   . COPD (chronic obstructive pulmonary disease) ([place])   . Depression   . MI (myocardial infarction) ([place]) 2014  . Substance abuse [facility])     Past Surgical History:  Procedure Laterality Date  . LEFT HEART CATH AND CORONARY ANGIOGRAPHY Right 12/03/2017   Procedure: LEFT HEART CATH AND CORONARY ANGIOGRAPHY;  Surgeon: Dionisio David, MD;  Location: [place] [facility];  Service: Cardiovascular;  Laterality: Right;  . TEE WITHOUT CARDIOVERSION N/A 12/25/2020   Procedure: TRANSESOPHAGEAL ECHOCARDIOGRAM (TEE);  Surgeon: Kate Sable, MD;  Location: [facility] ORS;  Service: Cardiovascular;  Laterality: N/A;  . TUBAL LIGATION      Family History  Problem Relation Age of Onset  . Colon cancer Mother   . Ovarian cancer Mother   . Throat cancer Mother    . Skin cancer Mother     Social History   Socioeconomic History  . Marital status: Single    Spouse name: Not on file  . Number of children: Not on file  . Years of education: Not on file  . Highest education level: Not on file  Occupational History  . Not on file  Tobacco Use  . Smoking status: Current Every Day Smoker    Packs/day: 0.50    Types: Cigarettes  . Smokeless tobacco: Never Used  . Tobacco comment: Started smoking at age 45.   Substance and Sexual Activity  . Alcohol use: No  . Drug use: Yes    Frequency: 1.0 times per week    Types: Marijuana    Comment: 1 joint/week  . Sexual activity: Not Currently  Other Topics Concern  . Not on file  Social History Narrative   Lives in [place]; works in group home; smoker; alcohol; hx of cocaine/marijuana [end of Nov 2021].    Social Determinants of Health   Financial Resource Strain: Not on file  Food Insecurity: Not on file  Transportation Needs: Not on file  Physical Activity: Not on file  Stress: Not on file  Social Connections: Not on file  Intimate Partner Violence: Not on file    Outpatient Medications Prior to Visit  Medication Sig Dispense Refill  . atorvastatin (LIPITOR) 40 MG tablet Take 1 tablet (40 mg total) by mouth daily. 30 tablet 5  . Buprenorphine HCl-Naloxone HCl 8-2 MG FILM Place 0.5 tablets under the tongue 2 (  two) times daily.    . isosorbide mononitrate (IMDUR) 30 MG 24 hr tablet Take 0.5 tablets (15 mg total) by mouth daily. 30 tablet 5  . nitroGLYCERIN (NITROSTAT) 0.4 MG SL tablet Place 1 tablet (0.4 mg total) under the tongue every 5 (five) minutes as needed for chest pain. 30 tablet 12  . VENTOLIN HFA 108 (90 Base) MCG/ACT inhaler INHALE 2 PUFFS INTO THE LUNGS EVERY 6 HOURS AS NEEDED FOR WHEEZING OR SHORTNESS OF BREATH. (Patient taking differently: Inhale 2 puffs into the lungs every 6 (six) hours as needed for wheezing.) 18 g 0  . aspirin EC 81 MG EC tablet Take 1 tablet (81 mg  total) by mouth daily. 30 tablet 0  . Ferrous Sulfate (IRON) 90 (18 Fe) MG TABS Take 1 tablet by mouth every other day. 30 tablet 3  . Fluticasone-Salmeterol (ADVAIR) 100-50 MCG/DOSE AEPB Inhale 1 puff into the lungs in the morning and at bedtime. (Patient taking differently: Inhale 1 puff into the lungs 2 (two) times daily.) 60 each 2  . omeprazole (PRILOSEC) 40 MG capsule Take 1 capsule (40 mg total) by mouth daily. 30 capsule 2   No facility-administered medications prior to visit.    Allergies  Allergen Reactions  . Penicillins Swelling    Has patient had a PCN reaction causing immediate rash, facial/tongue/throat swelling, SOB or lightheadedness with hypotension: Yes Has patient had a PCN reaction causing severe rash involving mucus membranes or skin necrosis: Unknown Has patient had a PCN reaction that required hospitalization: No Has patient had a PCN reaction occurring within the last 10 years: No If all of the above answers are "NO", then may proceed with Cephalosporin use.    ROS Review of Systems  Constitutional: Negative.   Respiratory: Negative.   Cardiovascular: Negative.   Gastrointestinal: Negative.   Neurological: Negative for facial asymmetry.  Psychiatric/Behavioral: Negative.       Objective:    Physical Exam HENT:     Head: Normocephalic and atraumatic.  Cardiovascular:     Rate and Rhythm: Normal rate and regular rhythm.     Pulses: Normal pulses.     Heart sounds: Normal heart sounds.  Pulmonary:     Effort: Pulmonary effort is normal.     Breath sounds: Normal breath sounds.  Abdominal:     General: Abdomen is flat. Bowel sounds are normal.     Palpations: Abdomen is soft.  Skin:    General: Skin is warm.  Neurological:     Mental Status: She is alert.  Psychiatric:        Mood and Affect: Mood normal.        Behavior: Behavior normal.        Thought Content: Thought content normal.        Judgment: Judgment normal.     BP (!) 126/91 (BP  Location: Left Arm, Patient Position: Sitting, Cuff Size: Normal)   Pulse 80   Temp (!) 91.9 F (33.3 C)   Resp 16   Wt 107 lb 9.6 oz (48.8 kg)   LMP 11/28/2017 (LMP Unknown)   SpO2 95%   BMI 19.68 kg/m  Wt Readings from Last 3 Encounters:  01/02/21 107 lb 9.6 oz (48.8 kg)  01/01/21 111 lb (50.3 kg)  12/25/20 111 lb (50.3 kg)     Health Maintenance Due  Topic Date Due  . COVID-19 Vaccine (1) Never done  . COLONOSCOPY (Pts 45-22yr Insurance coverage will need to be confirmed)  Never done  .  TETANUS/TDAP  Never done  . PAP SMEAR-Modifier  Never done    There are no preventive care reminders to display for this patient.  Lab Results  Component Value Date   TSH 4.430 10/30/2020   Lab Results  Component Value Date   WBC 6.6 12/23/2020   HGB 12.3 12/23/2020   HCT 41.9 12/23/2020   MCV 77.6 (L) 12/23/2020   PLT 195 12/23/2020   Lab Results  Component Value Date   NA 138 12/23/2020   K 4.7 12/23/2020   CO2 25 12/23/2020   GLUCOSE 88 12/23/2020   BUN 12 12/23/2020   CREATININE 0.72 12/23/2020   BILITOT 0.3 11/21/2020   ALKPHOS 75 11/21/2020   AST 66 (H) 11/21/2020   ALT 71 (H) 11/21/2020   PROT 7.3 11/21/2020   ALBUMIN 3.4 (L) 11/21/2020   CALCIUM 8.8 (L) 12/23/2020   ANIONGAP 7 12/23/2020   Lab Results  Component Value Date   CHOL 133 10/30/2020   Lab Results  Component Value Date   HDL 25 (L) 10/30/2020   Lab Results  Component Value Date   LDLCALC 93 10/30/2020   Lab Results  Component Value Date   TRIG 75 10/30/2020   Lab Results  Component Value Date   CHOLHDL 5.3 (H) 10/30/2020   Lab Results  Component Value Date   HGBA1C 5.7 (H) 10/30/2020      Assessment & Plan:    1. Iron deficiency anemia, unspecified iron deficiency anemia type -She will continue on current medication and continues to follow up with Hematologist. - aspirin 81 MG EC tablet; Take 1 tablet (81 mg total) by mouth daily.  Dispense: 30 tablet; Refill: 3 - Ferrous  Sulfate (IRON) 90 (18 Fe) MG TABS; Take 1 tablet by mouth every other day.  Dispense: 30 tablet; Refill: 3  2. Pulmonary emphysema, unspecified emphysema type ([place]) - Her breathing is stable and will continue on current medication, advised on smoking cessation. - Fluticasone-Salmeterol (ADVAIR) 100-50 MCG/DOSE AEPB; Inhale 1 puff into the lungs in the morning and at bedtime.  Dispense: 60 each; Refill: 2  3. Gastroesophageal reflux disease, unspecified whether esophagitis present - Her acid reflux is under control and she will continue on current medication. - omeprazole (PRILOSEC) 40 MG capsule; Take 1 capsule (40 mg total) by mouth daily.  Dispense: 30 capsule; Refill: 2  4. Abnormal CT of the abdomen - She will follow up with Nephrologist for right renal artery narrowing. - Ambulatory referral to Nephrology  5. Polysubstance abuse ([place]) -She declines rehabilitation and was strongly advised to stop using cocaine.    Follow-up: Return in about 3 months (around 04/02/2021), or if symptoms worsen or fail to improve.    Glynis Hunsucker Jerold Coombe, NP

## 2021-01-03 ENCOUNTER — Ambulatory Visit: Payer: Self-pay | Admitting: Cardiology

## 2021-01-06 NOTE — Progress Notes (Signed)
..  Feraheme was approved for additional refills on 01/03/2021. Approved for single dose therapy (2 Vials).  For DOS:TBD, Next DOS: TBD. Additional Fills requires Application filled out from Provider sections 1,3, 4,5,6 and send to [place].

## 2021-01-07 ENCOUNTER — Other Ambulatory Visit: Payer: Self-pay

## 2021-01-07 ENCOUNTER — Other Ambulatory Visit: Payer: Medicaid Other | Attending: Gastroenterology

## 2021-01-07 ENCOUNTER — Telehealth: Payer: Self-pay | Admitting: Gastroenterology

## 2021-01-07 ENCOUNTER — Telehealth (INDEPENDENT_AMBULATORY_CARE_PROVIDER_SITE_OTHER): Payer: Self-pay | Admitting: Cardiology

## 2021-01-07 ENCOUNTER — Other Ambulatory Visit: Payer: Self-pay | Admitting: Gastroenterology

## 2021-01-07 ENCOUNTER — Encounter: Payer: Self-pay | Admitting: Cardiology

## 2021-01-07 VITALS — Ht 62.0 in | Wt 108.0 lb

## 2021-01-07 DIAGNOSIS — I2510 Atherosclerotic heart disease of native coronary artery without angina pectoris: Secondary | ICD-10-CM

## 2021-01-07 DIAGNOSIS — F1910 Other psychoactive substance abuse, uncomplicated: Secondary | ICD-10-CM

## 2021-01-07 DIAGNOSIS — E785 Hyperlipidemia, unspecified: Secondary | ICD-10-CM

## 2021-01-07 MED ORDER — NA SULFATE-K SULFATE-MG SULF 17.5-3.13-1.6 GM/177ML PO SOLN: 1.0000 | Freq: Once | ORAL | 0 refills | Status: DC

## 2021-01-07 NOTE — Progress Notes (Signed)
Virtual Visit via Telephone Note   This visit type was conducted due to national recommendations for restrictions regarding the COVID-19 Pandemic (e.g. social distancing) in an effort to limit this patient's exposure and mitigate transmission in our community.  Due to her co-morbid illnesses, this patient is at least at moderate risk for complications without adequate follow up.  This format is felt to be most appropriate for this patient at this time.  The patient did not have access to video technology/had technical difficulties with video requiring transitioning to audio format only (telephone).  All issues noted in this document were discussed and addressed.  No physical exam could be performed with this format.  Please refer to the patient's chart for her  consent to telehealth for [facility].   Date:  01/07/2021   ID:  Denise Alexander, DOB 03/11/1971, MRN [id number]  Patient Location: Home Provider Location: Office/Clinic  PCP:  Langston Reusing, NP  Cardiologist:  Kate Sable, MD  Electrophysiologist:  None   Evaluation Performed:  Follow-Up Visit  Chief Complaint: Follow-up visit, chest tightness  History of Present Illness:    Denise Alexander is a 50 y.o. female with a hx of non obstructive NSTEMI/CAD ([place] 2018 -50% ost 1st diagonal), COPD, cocaine use, current smoker x 30+yrs who presents for follow-up.   Recently had a TEE after echocardiogram showed possible pulmonary valve abnormality.  TEE was normal.  No new concerns since TEE was performed on 12/25/2020.  Feels well, take all her medications as prescribed.  Had 1 episode of chest tightness which she attributes to anxiety.  Has not recurred.  States no using cocaine since her last visit.  Still smokes, working on quitting.  Recent renal artery ultrasound performed by primary care physician showed 50% stenosis.  Has appointment with primary care/nephrology upcoming.  She thinks this might have caused her to become more  anxious.  Prior notes Patient was evaluated in the ED in 2018 due to chest pains and elevated troponins, diagnosed with NSTEMI.  This occurred in the context of cocaine use.  Left heart cath 2018 showed 50% stenosis of first diagonal (<60m vessel), rest of coronaries were normal. Echocardiogram 11/2017 showed normal ejection fraction, EF 60%.  Mild MR.  Her symptoms were deemed likely from coronary vasospasm   The patient does not have symptoms concerning for COVID-19 infection (fever, chills, cough, or new shortness of breath).    Past Medical History:  Diagnosis Date  . Anemia   . COPD (chronic obstructive pulmonary disease) ([facility]   . Depression   . MI (myocardial infarction) ([facility] 2014  . Substance abuse ([facility]    Past Surgical History:  Procedure Laterality Date  . LEFT HEART CATH AND CORONARY ANGIOGRAPHY Right 12/03/2017   Procedure: LEFT HEART CATH AND CORONARY ANGIOGRAPHY;  Surgeon: KDionisio David MD;  [facility];  Service: Cardiovascular;  Laterality: Right;  . TEE WITHOUT CARDIOVERSION N/A 12/25/2020   Procedure: TRANSESOPHAGEAL ECHOCARDIOGRAM (TEE);  Surgeon: AKate Sable MD;  Location: [facility] ORS;  Service: Cardiovascular;  Laterality: N/A;  . TUBAL LIGATION       Current Meds  Medication Sig  . aspirin 81 MG EC tablet Take 1 tablet (81 mg total) by mouth daily.  .Marland Kitchenatorvastatin (LIPITOR) 40 MG tablet Take 1 tablet (40 mg total) by mouth daily.  . Buprenorphine HCl-Naloxone HCl 8-2 MG FILM Place 0.5 tablets under the tongue 2 (two) times daily.  . Ferrous Sulfate (IRON) 90 (18  Fe) MG TABS Take 1 tablet by mouth every other day.  . Fluticasone-Salmeterol (ADVAIR) 100-50 MCG/DOSE AEPB Inhale 1 puff into the lungs in the morning and at bedtime.  . isosorbide mononitrate (IMDUR) 30 MG 24 hr tablet Take 0.5 tablets (15 mg total) by mouth daily.  . nitroGLYCERIN (NITROSTAT) 0.4 MG SL tablet Place 1 tablet (0.4 mg total) under the tongue every 5 (five)  minutes as needed for chest pain.  Marland Kitchen omeprazole (PRILOSEC) 40 MG capsule Take 1 capsule (40 mg total) by mouth daily.  . VENTOLIN HFA 108 (90 Base) MCG/ACT inhaler INHALE 2 PUFFS INTO THE LUNGS EVERY 6 HOURS AS NEEDED FOR WHEEZING OR SHORTNESS OF BREATH. (Patient taking differently: Inhale 2 puffs into the lungs every 6 (six) hours as needed for wheezing.)     Allergies:   Penicillins   Social History   Tobacco Use  . Smoking status: Current Every Day Smoker    Packs/day: 0.50    Types: Cigarettes  . Smokeless tobacco: Never Used  . Tobacco comment: Started smoking at age 62.   Substance Use Topics  . Alcohol use: No  . Drug use: Yes    Frequency: 1.0 times per week    Types: Marijuana    Comment: 1 joint/week     Family Hx: The patient's family history includes Colon cancer in her mother; Ovarian cancer in her mother; Skin cancer in her mother; Throat cancer in her mother.  ROS:   Please see the history of present illness.     All other systems reviewed and are negative.   Prior CV studies:   The following studies were reviewed today:    Labs/Other Tests and Data Reviewed:    EKG:  No ECG reviewed.  Recent Labs: 10/30/2020: TSH 4.430 11/21/2020: ALT 71 12/23/2020: BUN 12; Creatinine, Ser 0.72; Hemoglobin 12.3; Platelets 195; Potassium 4.7; Sodium 138   Recent Lipid Panel Lab Results  Component Value Date/Time   CHOL 133 10/30/2020 10:07 AM   TRIG 75 10/30/2020 10:07 AM   HDL 25 (L) 10/30/2020 10:07 AM   CHOLHDL 5.3 (H) 10/30/2020 10:07 AM   CHOLHDL 5.5 12/03/2017 04:14 AM   LDLCALC 93 10/30/2020 10:07 AM    Wt Readings from Last 3 Encounters:  01/07/21 108 lb (49 kg)  01/02/21 107 lb 9.6 oz (48.8 kg)  01/01/21 111 lb (50.3 kg)     Objective:    Vital Signs:  Ht 5' 2"  (1.575 m)   Wt 108 lb (49 kg)   LMP 11/28/2017 (LMP Unknown)   BMI 19.75 kg/m    VITAL SIGNS:  reviewed  ASSESSMENT & PLAN:    1. 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   1 episode of chest pain, otherwise doing well.  Continue aspirin, Lipitor, Imdur.  Echo with normal systolic and diastolic function.  TEE with no pulmonary valvular abnormality.   2. Hyperlipidemia, continue Lipitor 40 mg daily 3. Smoking and cocaine use.  Cessation again is advised.  Follow-up in 1 year.  COVID-19 Education: The signs and symptoms of COVID-19 were discussed with the patient and how to seek care for testing (follow up with PCP or arrange E-visit).  The importance of social distancing was discussed today.  Time:   Today, I have spent 35 minutes with the patient with telehealth technology discussing the above problems.     Medication Adjustments/Labs and Tests Ordered: Current medicines are reviewed at length with the patient today.  Concerns regarding medicines are outlined above.  Tests Ordered: No orders of the defined types were placed in this encounter.   Medication Changes: No orders of the defined types were placed in this encounter.   Follow Up:  In Person in 1 year(s)  Signed, Kate Sable, MD  01/07/2021 3:29 PM    [place]

## 2021-01-07 NOTE — Telephone Encounter (Signed)
Patient states she does not have prep for procedure THIS Thursday 1.20.22.

## 2021-01-07 NOTE — Patient Instructions (Signed)
Medication Instructions:  Your physician recommends that you continue on your current medications as directed. Please refer to the Current Medication list given to you today.  *If you need a refill on your cardiac medications before your next appointment, please call your pharmacy*   Follow-Up: At [facility], you and your health needs are our priority.  As part of our continuing mission to provide you with exceptional heart care, we have created designated Provider Care Teams.  These Care Teams include your primary Cardiologist (physician) and Advanced Practice Providers (APPs -  Physician Assistants and Nurse Practitioners) who all work together to provide you with the care you need, when you need it.  We recommend signing up for the patient portal called "MyChart".  Sign up information is provided on this After Visit Summary.  MyChart is used to connect with patients for Virtual Visits (Telemedicine).  Patients are able to view lab/test results, encounter notes, upcoming appointments, etc.  Non-urgent messages can be sent to your provider as well.   To learn more about what you can do with MyChart, go to [email or web address].    Your next appointment:   12 month(s)  The format for your next appointment:   In Person  Provider:   You may see Brian Agbor-Etang, MD or one of the following Advanced Practice Providers on your designated Care Team:    Christopher Berge, NP  Ryan Dunn, PA-C  Jacquelyn Visser, PA-C  Cadence Furth, PA-C  Caitlin Walker, NP   

## 2021-01-08 ENCOUNTER — Other Ambulatory Visit
Admission: RE | Admit: 2021-01-08 | Discharge: 2021-01-08 | Disposition: A | Discharge status: Home / Self Care | Payer: Medicaid Other | Source: Ambulatory Visit | Attending: Gastroenterology | Admitting: Gastroenterology

## 2021-01-08 ENCOUNTER — Other Ambulatory Visit: Payer: Self-pay

## 2021-01-08 DIAGNOSIS — Z20822 Contact with and (suspected) exposure to covid-19: Secondary | ICD-10-CM | POA: Diagnosis not present

## 2021-01-08 DIAGNOSIS — Z01812 Encounter for preprocedural laboratory examination: Secondary | ICD-10-CM | POA: Insufficient documentation

## 2021-01-08 LAB — SARS CORONAVIRUS 2 (TAT 6-24 HRS): SARS Coronavirus 2: NEGATIVE

## 2021-01-09 ENCOUNTER — Ambulatory Visit: Payer: Self-pay | Admitting: Anesthesiology

## 2021-01-09 ENCOUNTER — Encounter
Admission: RE | Disposition: A | Discharge status: Home / Self Care | Payer: Self-pay | Source: Home / Self Care | Attending: Gastroenterology

## 2021-01-09 ENCOUNTER — Other Ambulatory Visit: Payer: Self-pay

## 2021-01-09 ENCOUNTER — Encounter: Payer: Self-pay | Admitting: Gastroenterology

## 2021-01-09 ENCOUNTER — Ambulatory Visit
Admission: RE | Admit: 2021-01-09 | Discharge: 2021-01-09 | Disposition: A | Discharge status: Home / Self Care | Payer: Self-pay | Attending: Gastroenterology | Admitting: Gastroenterology

## 2021-01-09 DIAGNOSIS — D509 Iron deficiency anemia, unspecified: Secondary | ICD-10-CM | POA: Insufficient documentation

## 2021-01-09 DIAGNOSIS — K2950 Unspecified chronic gastritis without bleeding: Secondary | ICD-10-CM | POA: Insufficient documentation

## 2021-01-09 DIAGNOSIS — K648 Other hemorrhoids: Secondary | ICD-10-CM | POA: Insufficient documentation

## 2021-01-09 DIAGNOSIS — K900 Celiac disease: Secondary | ICD-10-CM

## 2021-01-09 DIAGNOSIS — F17210 Nicotine dependence, cigarettes, uncomplicated: Secondary | ICD-10-CM | POA: Insufficient documentation

## 2021-01-09 DIAGNOSIS — Z1211 Encounter for screening for malignant neoplasm of colon: Secondary | ICD-10-CM | POA: Insufficient documentation

## 2021-01-09 DIAGNOSIS — K6389 Other specified diseases of intestine: Secondary | ICD-10-CM | POA: Insufficient documentation

## 2021-01-09 DIAGNOSIS — K3189 Other diseases of stomach and duodenum: Secondary | ICD-10-CM | POA: Insufficient documentation

## 2021-01-09 DIAGNOSIS — Z79899 Other long term (current) drug therapy: Secondary | ICD-10-CM | POA: Insufficient documentation

## 2021-01-09 DIAGNOSIS — Z7982 Long term (current) use of aspirin: Secondary | ICD-10-CM | POA: Insufficient documentation

## 2021-01-09 DIAGNOSIS — Z7951 Long term (current) use of inhaled steroids: Secondary | ICD-10-CM | POA: Insufficient documentation

## 2021-01-09 DIAGNOSIS — D72820 Lymphocytosis (symptomatic): Secondary | ICD-10-CM | POA: Insufficient documentation

## 2021-01-09 DIAGNOSIS — K2289 Other specified disease of esophagus: Secondary | ICD-10-CM | POA: Insufficient documentation

## 2021-01-09 HISTORY — PX: ESOPHAGOGASTRODUODENOSCOPY (EGD) WITH PROPOFOL: SHX5813

## 2021-01-09 HISTORY — PX: COLONOSCOPY WITH PROPOFOL: SHX5780

## 2021-01-09 LAB — URINE DRUG SCREEN, QUALITATIVE (ARMC ONLY)
Amphetamines, Ur Screen: NOT DETECTED
Barbiturates, Ur Screen: NOT DETECTED
Benzodiazepine, Ur Scrn: NOT DETECTED
Cannabinoid 50 Ng, Ur ~~LOC~~: NOT DETECTED
Cocaine Metabolite,Ur ~~LOC~~: NOT DETECTED
MDMA (Ecstasy)Ur Screen: NOT DETECTED
Methadone Scn, Ur: NOT DETECTED
Opiate, Ur Screen: NOT DETECTED
Phencyclidine (PCP) Ur S: NOT DETECTED
Tricyclic, Ur Screen: NOT DETECTED

## 2021-01-09 SURGERY — ESOPHAGOGASTRODUODENOSCOPY (EGD) WITH PROPOFOL
Anesthesia: General

## 2021-01-09 MED ORDER — EPHEDRINE 5 MG/ML INJ
INTRAVENOUS | Status: AC
  Filled 2021-01-09: qty 10

## 2021-01-09 MED ORDER — SODIUM CHLORIDE 0.9 % IV SOLN: INTRAVENOUS | Status: DC

## 2021-01-09 MED ORDER — LIDOCAINE HCL (CARDIAC) PF 100 MG/5ML IV SOSY
PREFILLED_SYRINGE | INTRAVENOUS | Status: DC | PRN
Start: 2021-01-09 — End: 2021-01-09
  Administered 2021-01-09: 100 mg via INTRAVENOUS

## 2021-01-09 MED ORDER — MIDAZOLAM HCL 2 MG/2ML IJ SOLN
INTRAMUSCULAR | Status: AC
  Filled 2021-01-09: qty 2

## 2021-01-09 MED ORDER — PHENYLEPHRINE HCL (PRESSORS) 10 MG/ML IV SOLN
INTRAVENOUS | Status: DC | PRN
  Administered 2021-01-09 (×3): 100 ug via INTRAVENOUS

## 2021-01-09 MED ORDER — PROPOFOL 10 MG/ML IV BOLUS
INTRAVENOUS | Status: DC | PRN
  Administered 2021-01-09: 20 mg via INTRAVENOUS
  Administered 2021-01-09: 30 mg via INTRAVENOUS
  Administered 2021-01-09: 50 mg via INTRAVENOUS

## 2021-01-09 MED ORDER — MIDAZOLAM HCL 2 MG/2ML IJ SOLN
INTRAMUSCULAR | Status: DC | PRN
  Administered 2021-01-09: 2 mg via INTRAVENOUS

## 2021-01-09 MED ORDER — PROPOFOL 500 MG/50ML IV EMUL
INTRAVENOUS | Status: DC | PRN
  Administered 2021-01-09: 180 ug/kg/min via INTRAVENOUS

## 2021-01-09 NOTE — Transfer of Care (Signed)
Immediate Anesthesia Transfer of Care Note  Patient: Denise Alexander  Procedure(s) Performed: ESOPHAGOGASTRODUODENOSCOPY (EGD) WITH PROPOFOL (N/A ) COLONOSCOPY WITH PROPOFOL (N/A )  Patient Location: PACU  Anesthesia Type:General  Level of Consciousness: drowsy  Airway & Oxygen Therapy: Patient Spontanous Breathing and Patient connected to nasal cannula oxygen  Post-op Assessment: Report given to RN and Post -op Vital signs reviewed and stable  Post vital signs: Reviewed and stable  Last Vitals:  Vitals Value Taken Time  BP 97/70 01/09/21 1228  Temp 35.8 C 01/09/21 1225  Pulse 79 01/09/21 1229  Resp 17 01/09/21 1229  SpO2 98 % 01/09/21 1229  Vitals shown include unvalidated device data.  Last Pain:  Vitals:   01/09/21 1225  TempSrc: Temporal  PainSc: Asleep         Complications: No complications documented.

## 2021-01-09 NOTE — Telephone Encounter (Signed)
Called patient's pharmacy to make sure that she had picked up her prescription and they stated that she did.

## 2021-01-09 NOTE — Op Note (Signed)
[facility] Patient Name: Denise Alexander Procedure Date: 01/09/2021 11:42 AM MRN: [id number] Account #: [id number] Date of Birth: 1971-08-05 Admit Type: Outpatient Age: 50 Room: [facility] ENDO ROOM 3 Gender: Female Note Status: Finalized Procedure:             Upper GI endoscopy Indications:           Iron deficiency anemia Providers:             Tomi Paddock B. Bonna Gains MD, MD Referring MD:          Lonny Prude Iloabachie (Referring MD) Medicines:             Monitored Anesthesia Care Complications:         No immediate complications. Procedure:             Pre-Anesthesia Assessment:                        - The risks and benefits of the procedure and the                         sedation options and risks were discussed with the                         patient. All questions were answered and informed                         consent was obtained.                        - Patient identification and proposed procedure were                         verified prior to the procedure.                        - ASA Grade Assessment: II - A patient with mild                         systemic disease.                        After obtaining informed consent, the endoscope was                         passed under direct vision. Throughout the procedure,                         the patient's blood pressure, pulse, and oxygen                         saturations were monitored continuously. The Endoscope                         was introduced through the mouth, and advanced to the                         second part of duodenum. The upper GI endoscopy was                         accomplished with  ease. The patient tolerated the                         procedure well. 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      The exam of the esophagus was otherwise normal.      The entire examined stomach was normal. Biopsies were  taken with a cold       forceps for Helicobacter pylori testing.      Decreased folds were found in the second portion of the duodenum and       flattening was found in the entire duodenum. Biopsies were taken with a       cold forceps for histology. Impression:            - Ectopic gastric mucosa in the proximal esophagus.                        - Normal stomach. Biopsied.                        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 Biopsied. Recommendation:        - Await pathology results.                        - Discharge patient to home.                        - Continue present medications.                        - Resume previous diet.                        - The findings and recommendations were discussed with                         the patient.                        - The findings and recommendations were discussed with                         the patient's family.                        - Return to primary care physician as previously                         scheduled. Procedure Code(s):     --- Professional ---                        [phone], Esophagogastroduodenoscopy, flexible,                         transoral; with biopsy, single or multiple Diagnosis Code(s):     --- Professional ---  Q40.2, Other specified congenital malformations of                         stomach                        K31.89, Other diseases of stomach and duodenum                        D50.9, Iron deficiency anemia, unspecified CPT copyright 2019 American Medical Association. All rights reserved. The codes documented in this report are preliminary and upon coder review may  be revised to meet current compliance requirements.  [name], MD Margretta Sidle B. Bonna Gains MD, MD 01/09/2021 12:08:26 PM This report has been signed electronically. Number of Addenda: 0 Note Initiated On: 01/09/2021 11:42 AM Estimated Blood Loss:   Estimated blood loss: none.      [facility]

## 2021-01-09 NOTE — Anesthesia Procedure Notes (Signed)
Performed by: Demetrius Charity, CRNA Pre-anesthesia Checklist: Patient identified, Emergency Drugs available, Patient being monitored, Suction available and Timeout performed Patient Re-evaluated:Patient Re-evaluated prior to induction Oxygen Delivery Method: Nasal cannula Induction Type: IV induction Placement Confirmation: CO2 detector and positive ETCO2

## 2021-01-09 NOTE — Op Note (Signed)
[facility] Patient Name: Denise Alexander Procedure Date: 01/09/2021 11:39 AM MRN: [id number] Account #: [id number] Date of Birth: November 30, 1971 Admit Type: Outpatient Age: 50 Room: [facility] ENDO ROOM 3 Gender: Female Note Status: Finalized Procedure:             Colonoscopy Indications:           Screening for colorectal malignant neoplasm Providers:             Mio Schellinger B. Bonna Gains MD, MD Referring MD:          Lonny Prude [place]lo[place]achie (Referring MD) Medicines:             Monitored Anesthesia Care Complications:         No immediate complications. Procedure:             Pre-Anesthesia Assessment:                        - Prior to the procedure, a History and Physical was                         performed, and patient medications, allergies and                         sensitivities were reviewed. The patient's tolerance                         of previous anesthesia was reviewed.                        - The risks and benefits of the procedure and the                         sedation options and risks were discussed with the                         patient. All questions were answered and informed                         consent was obtained.                        - Patient identification and proposed procedure were                         verified prior to the procedure by the physician, the                         nurse, the anesthetist and the technician. The                         procedure was verified in the pre-procedure area in                         the procedure room in the endoscopy suite.                        - ASA Grade Assessment: II - A patient with mild  systemic disease.                        - After reviewing the risks and benefits, the patient                         was deemed in satisfactory condition to undergo the                         procedure.                        After obtaining informed consent, the  colonoscope was                         passed under direct vision. Throughout the procedure,                         the patient's blood pressure, pulse, and oxygen                         saturations were monitored continuously. The                         Colonoscope was introduced through the anus and                         advanced to the the cecum, identified by appendiceal                         orifice and ileocecal valve. The colonoscopy was                         performed with ease. The patient tolerated the                         procedure well. The quality of the bowel preparation                         was poor. Findings:      The perianal and digital rectal examinations were normal.      A large amount of stool was found in the entire colon, interfering with       visualization. Biopsies for histology were taken with a cold forceps       from the entire colon for evaluation of microscopic colitis.      The exam was otherwise normal throughout the examined colon.      Non-bleeding internal hemorrhoids were found during retroflexion. Impression:            - Preparation of the colon was poor.                        - Stool in the entire examined colon. Biopsied.                        - Non-bleeding internal hemorrhoids.                        - Due to the prep, this was not a satisfactory exam  for colorectal cancer screening, that is evaluation                         for small or flat lesions or polyps. Patient should                         follow up in clinic after discharge to discuss need                         for future colonoscopy and colorectal cancer screening. Recommendation:        - Discharge patient to home.                        - Resume previous diet.                        - Continue present medications.                        - Repeat colonoscopy at the next available                         appointment, with 2 day prep,  because the bowel                         preparation was poor.                        - Return to primary care physician as previously                         scheduled.                        - The findings and recommendations were discussed with                         the patient.                        - The findings and recommendations were discussed with                         the patient's family. Procedure Code(s):     --- Professional ---                        [phone], Colonoscopy, flexible; with biopsy, single or                         multiple Diagnosis Code(s):     --- Professional ---                        Z12.11, Encounter for screening for malignant neoplasm                         of colon CPT copyright 2019 American Medical Association. All rights reserved. The codes documented in this report are preliminary and upon coder review may  be revised to meet current compliance requirements.  [name], MD Margretta Sidle B. Bonna Gains MD, MD  01/09/2021 12:23:56 PM This report has been signed electronically. Number of Addenda: 0 Note Initiated On: 01/09/2021 11:39 AM Scope Withdrawal Time: 0 hours 6 minutes 20 seconds  Total Procedure Duration: 0 hours 9 minutes 49 seconds  Estimated Blood Loss:  Estimated blood loss: none.      [facility]

## 2021-01-09 NOTE — Anesthesia Postprocedure Evaluation (Signed)
Anesthesia Post Note  Patient: Denise Alexander  Procedure(s) Performed: ESOPHAGOGASTRODUODENOSCOPY (EGD) WITH PROPOFOL (N/A ) COLONOSCOPY WITH PROPOFOL (N/A )  Patient location during evaluation: PACU Anesthesia Type: General Level of consciousness: awake and alert, awake and oriented Pain management: pain level controlled Vital Signs Assessment: post-procedure vital signs reviewed and stable Respiratory status: spontaneous breathing, nonlabored ventilation and respiratory function stable Cardiovascular status: blood pressure returned to baseline and stable Postop Assessment: no apparent nausea or vomiting Anesthetic complications: no   No complications documented.   Last Vitals:  Vitals:   01/09/21 1230 01/09/21 1233  BP: 97/70 90/61  Pulse: 80 80  Resp: 18 20  Temp:    SpO2: 98% 100%    Last Pain:  Vitals:   01/09/21 1233  TempSrc:   PainSc: Asleep                 Phill Mutter

## 2021-01-09 NOTE — Brief Op Note (Signed)
Urine sent for Drug Screen pre-procedure

## 2021-01-09 NOTE — Anesthesia Preprocedure Evaluation (Signed)
Anesthesia Evaluation  Patient identified by MRN, date of birth, ID band Patient awake    Reviewed: Allergy & Precautions, H&P , NPO status , Patient's Chart, lab work & pertinent test results  Airway Mallampati: II  TM Distance: >3 FB Neck ROM: Full    Dental  (+) Poor Dentition, Loose, Chipped   Pulmonary COPD,  COPD inhaler, Current Smoker and Patient abstained from smoking.,    Pulmonary exam normal        Cardiovascular hypertension, Pt. on medications + CAD and + Past MI  Normal cardiovascular exam     Neuro/Psych PSYCHIATRIC DISORDERS Depression Bipolar Disorder negative neurological ROS     GI/Hepatic Neg liver ROS, GERD  Medicated,  Endo/Other  negative endocrine ROS  Renal/GU negative Renal ROS  negative genitourinary   Musculoskeletal negative musculoskeletal ROS (+)   Abdominal   Peds negative pediatric ROS (+)  Hematology negative hematology ROS (+) anemia ,   Anesthesia Other Findings Procedure Cx 01-01-21 secondary to + UDS  ANEMIA, IRON DEFICIENCY  Symptomatic anemia  BIPOLAR DISORDER UNSPECIFIED  DEPRESSION/ANXIETY  TOBACCO ABUSE  Lower extremity edema  Encounter to establish care  Polysubstance abuse (HCC)  Elevated liver enzymes  Prediabetes  Vaginospasm  Pulmonary valve lesion  Abnormal CT of the abdomen  . Anemia  . COPD (chronic obstructive pulmonary disease) ([place])  . Depression  . MI (myocardial infarction) ([place]) 2014 . Substance abuse ([place])       Reproductive/Obstetrics negative OB ROS                            Anesthesia Physical Anesthesia Plan  ASA: III  Anesthesia Plan: General   Post-op Pain Management:    Induction: Intravenous  PONV Risk Score and Plan: 2 and Propofol infusion  Airway Management Planned: Nasal Cannula  Additional Equipment:   Intra-op Plan:   Post-operative Plan:   Informed Consent: I have  reviewed the patients History and Physical, chart, labs and discussed the procedure including the risks, benefits and alternatives for the proposed anesthesia with the patient or authorized representative who has indicated his/her understanding and acceptance.     Dental advisory given  Plan Discussed with: CRNA, Anesthesiologist and Surgeon  Anesthesia Plan Comments:         Anesthesia Quick Evaluation

## 2021-01-09 NOTE — H&P (Signed)
[name], MD [address] [place], [place], [place], [address] [address], [place], [place], [place], [address] Phone: [phone]  Fax: [phone]  Primary Care Physician:  Langston Reusing, NP   Pre-Procedure History & Physical: HPI:  Denise Alexander is a 50 y.o. female is here for a colonoscopy and EGD.   Past Medical History:  Diagnosis Date  . Anemia   . COPD (chronic obstructive pulmonary disease) ([place])   . Depression   . MI (myocardial infarction) ([place]) 2014  . Substance abuse [facility])     Past Surgical History:  Procedure Laterality Date  . LEFT HEART CATH AND CORONARY ANGIOGRAPHY Right 12/03/2017   Procedure: LEFT HEART CATH AND CORONARY ANGIOGRAPHY;  Surgeon: Dionisio David, MD;  Location: [facility];  Service: Cardiovascular;  Laterality: Right;  . TEE WITHOUT CARDIOVERSION N/A 12/25/2020   Procedure: TRANSESOPHAGEAL ECHOCARDIOGRAM (TEE);  Surgeon: Kate Sable, MD;  Location: [facility];  Service: Cardiovascular;  Laterality: N/A;  . TUBAL LIGATION      Prior to Admission medications   Medication Sig Start Date End Date Taking? Authorizing Provider  aspirin 81 MG EC tablet Take 1 tablet (81 mg total) by mouth daily. 01/02/21  Yes Iloabachie, Chioma E, NP  atorvastatin (LIPITOR) 40 MG tablet Take 1 tablet (40 mg total) by mouth daily. 11/05/20 02/03/21 Yes Agbor-Etang, Aaron Edelman, MD  Buprenorphine HCl-Naloxone HCl 8-2 MG FILM Place 0.5 tablets under the tongue 2 (two) times daily.   Yes [provider]  Ferrous Sulfate (IRON) 90 (18 Fe) MG TABS Take 1 tablet by mouth every other day. 01/02/21  Yes Iloabachie, Chioma E, NP  Fluticasone-Salmeterol (ADVAIR) 100-50 MCG/DOSE AEPB Inhale 1 puff into the lungs in the morning and at bedtime. 01/02/21  Yes Iloabachie, Chioma E, NP  isosorbide mononitrate (IMDUR) 30 MG 24 hr tablet Take 0.5 tablets (15 mg total) by mouth daily. 11/05/20 02/03/21 Yes Agbor-Etang, Aaron Edelman, MD  omeprazole (PRILOSEC)  40 MG capsule Take 1 capsule (40 mg total) by mouth daily. 01/02/21  Yes Iloabachie, Chioma E, NP  VENTOLIN HFA 108 (90 Base) MCG/ACT inhaler INHALE 2 PUFFS INTO THE LUNGS EVERY 6 HOURS AS NEEDED FOR WHEEZING OR SHORTNESS OF BREATH. Patient taking differently: Inhale 2 puffs into the lungs every 6 (six) hours as needed for wheezing. 11/26/20  Yes Iloabachie, Chioma E, NP  nitroGLYCERIN (NITROSTAT) 0.4 MG SL tablet Place 1 tablet (0.4 mg total) under the tongue every 5 (five) minutes as needed for chest pain. 10/17/20   Iloabachie, Chioma E, NP  polyethylene glycol-electrolytes (NULYTELY) 420 g solution Prepare according to package instructions. Starting at 5:00 PM: Drink one 8 oz glass of mixture every 15 minutes until you finish half of the jug. Five hours prior to procedure, drink 8 oz glass of mixture every 15 minutes until it is all gone. Make sure you do not drink anything 4 hours prior to your procedure. 01/07/21   Virgel Manifold, MD    Allergies as of 01/02/2021 - Review Complete 01/01/2021  Allergen Reaction Noted  . Penicillins Swelling 08/12/2017    Family History  Problem Relation Age of Onset  . Colon cancer Mother   . Ovarian cancer Mother   . Throat cancer Mother   . Skin cancer Mother     Social History   Socioeconomic History  . Marital status: Single    Spouse name: Not on file  . Number of children: Not on file  . Years of education: Not  on file  . Highest education level: Not on file  Occupational History  . Not on file  Tobacco Use  . Smoking status: Current Every Day Smoker    Packs/day: 0.50    Types: Cigarettes  . Smokeless tobacco: Never Used  . Tobacco comment: Started smoking at age 52.   Vaping Use  . Vaping Use: Some days  Substance and Sexual Activity  . Alcohol use: No  . Drug use: Yes    Frequency: 1.0 times per week    Types: Marijuana    Comment: 1 joint/week  . Sexual activity: Not Currently  Other Topics Concern  . Not on file   Social History Narrative   Lives in [place]; works in group home; smoker; alcohol; hx of cocaine/marijuana [end of Nov 2021].    Social Determinants of Health   Financial Resource Strain: Not on file  Food Insecurity: Not on file  Transportation Needs: Not on file  Physical Activity: Not on file  Stress: Not on file  Social Connections: Not on file  Intimate Partner Violence: Not on file    Review of Systems: See HPI, otherwise negative ROS  Physical Exam: BP 127/83   Pulse 86   Temp 98.9 F (37.2 C) (Temporal)   Resp 18   Ht 5' 2"  (1.575 m)   Wt 49.9 kg   LMP 11/28/2017 (LMP Unknown)   SpO2 98%   BMI 20.12 kg/m  General:   Alert,  pleasant and cooperative in NAD Head:  Normocephalic and atraumatic. Neck:  Supple; no masses or thyromegaly. Lungs:  Clear throughout to auscultation, normal respiratory effort.    Heart:  +S1, +S2, Regular rate and rhythm, No edema. Abdomen:  Soft, nontender and nondistended. Normal bowel sounds, without guarding, and without rebound.   Neurologic:  Alert and  oriented x4;  grossly normal neurologically.  Impression/Plan: Denise Alexander is here for a colonoscopy to be performed for average risk screening and EGD for iron def anemia  Risks, benefits, limitations, and alternatives regarding the procedures have been reviewed with the patient.  Questions have been answered.  All parties agreeable.   Virgel Manifold, MD  01/09/2021, 11:39 AM

## 2021-01-10 ENCOUNTER — Encounter: Payer: Self-pay | Admitting: Gastroenterology

## 2021-01-10 ENCOUNTER — Other Ambulatory Visit: Payer: Medicaid Other

## 2021-01-10 ENCOUNTER — Ambulatory Visit: Payer: Medicaid Other

## 2021-01-10 ENCOUNTER — Ambulatory Visit: Payer: Medicaid Other | Admitting: Internal Medicine

## 2021-01-10 LAB — SURGICAL PATHOLOGY

## 2021-01-23 ENCOUNTER — Telehealth: Payer: Self-pay

## 2021-01-23 NOTE — Telephone Encounter (Signed)
-----   Message from Virgel Manifold, MD sent at 01/20/2021  9:56 AM EST ----- Herb Grays please let the patient know, her biopsies showed celiac disease and also lymphocytic colitis.  I recommend that she start a gluten-free diet.  Please change her appointment with me to this week or next week, phone visit okay so we can discuss results and next steps

## 2021-01-23 NOTE — Telephone Encounter (Signed)
Pt notified of results. Pt scheduled for a follow up next week to discuss.

## 2021-01-30 ENCOUNTER — Ambulatory Visit (INDEPENDENT_AMBULATORY_CARE_PROVIDER_SITE_OTHER): Payer: Self-pay | Admitting: Gastroenterology

## 2021-01-30 ENCOUNTER — Other Ambulatory Visit: Payer: Self-pay

## 2021-01-30 ENCOUNTER — Other Ambulatory Visit: Payer: Self-pay | Admitting: Gastroenterology

## 2021-01-30 ENCOUNTER — Encounter: Payer: Self-pay | Admitting: Gastroenterology

## 2021-01-30 ENCOUNTER — Telehealth: Payer: Self-pay | Admitting: Gerontology

## 2021-01-30 VITALS — BP 123/81 | HR 76 | Temp 98.1°F | Ht 62.0 in | Wt 110.0 lb

## 2021-01-30 DIAGNOSIS — K52832 Lymphocytic colitis: Secondary | ICD-10-CM

## 2021-01-30 DIAGNOSIS — K900 Celiac disease: Secondary | ICD-10-CM

## 2021-01-30 MED ORDER — BUDESONIDE 3 MG PO CPEP: ORAL_CAPSULE | ORAL | 0 refills | Status: DC

## 2021-01-30 NOTE — Telephone Encounter (Signed)
Pt called saying that she received 2 calls from [place] that she wasn't able to answer. She also stated that she was told to see a kidney specialist.   MD 01/30/21 @ 2:15

## 2021-01-30 NOTE — Patient Instructions (Signed)
We will send your referral to the nutritionist/dietician and then they will call you with an appointment date and time.

## 2021-01-31 NOTE — Progress Notes (Signed)
[name], MD [address]  [place]  [place], [place] [address]  Main: [phone]  Fax: [phone]   Primary Care Physician: Langston Reusing, NP   Chief complaint: Anemia, celiac disease  HPI: Denise Alexander is a 50 y.o. female here for follow-up of iron deficiency anemia secondary to her celiac disease.  See EGD and colonoscopy report.  Biopsies consistent with lymphocytic colitis as well.  Patient reports 1-4 loose bowel movements a day.  No blood in stool.  Has started to try a gluten-free diet, but not consistent with it.  No nausea, vomiting, no weight loss.  Current Outpatient Medications  Medication Sig Dispense Refill  . aspirin 81 MG EC tablet Take 1 tablet (81 mg total) by mouth daily. 30 tablet 3  . atorvastatin (LIPITOR) 40 MG tablet Take 1 tablet (40 mg total) by mouth daily. 30 tablet 5  . budesonide (ENTOCORT EC) 3 MG 24 hr capsule Take 3 capsules (9 mg total) by mouth daily for 42 days, THEN 2 capsules (6 mg total) daily for 14 days, THEN 1 capsule (3 mg total) daily for 14 days. 168 capsule 0  . Buprenorphine HCl-Naloxone HCl 8-2 MG FILM Place 0.5 tablets under the tongue 2 (two) times daily.    . Ferrous Sulfate (IRON) 90 (18 Fe) MG TABS Take 1 tablet by mouth every other day. 30 tablet 3  . Fluticasone-Salmeterol (ADVAIR) 100-50 MCG/DOSE AEPB Inhale 1 puff into the lungs in the morning and at bedtime. 60 each 2  . isosorbide mononitrate (IMDUR) 30 MG 24 hr tablet Take 0.5 tablets (15 mg total) by mouth daily. 30 tablet 5  . nitroGLYCERIN (NITROSTAT) 0.4 MG SL tablet Place 1 tablet (0.4 mg total) under the tongue every 5 (five) minutes as needed for chest pain. 30 tablet 12  . omeprazole (PRILOSEC) 40 MG capsule Take 1 capsule (40 mg total) by mouth daily. 30 capsule 2  . VENTOLIN HFA 108 (90 Base) MCG/ACT inhaler INHALE 2 PUFFS INTO THE LUNGS EVERY 6 HOURS AS NEEDED FOR WHEEZING OR SHORTNESS OF BREATH. (Patient taking differently: Inhale 2 puffs  into the lungs every 6 (six) hours as needed for wheezing.) 18 g 0   No current facility-administered medications for this visit.    Allergies as of 01/30/2021 - Review Complete 01/30/2021  Allergen Reaction Noted  . Penicillins Swelling 08/12/2017    ROS:  General: Negative for anorexia, weight loss, fever, chills, fatigue, weakness. ENT: Negative for hoarseness, difficulty swallowing , nasal congestion. CV: Negative for chest pain, angina, palpitations, dyspnea on exertion, peripheral edema.  Respiratory: Negative for dyspnea at rest, dyspnea on exertion, cough, sputum, wheezing.  GI: See history of present illness. GU:  Negative for dysuria, hematuria, urinary incontinence, urinary frequency, nocturnal urination.  Endo: Negative for unusual weight change.    Physical Examination:   BP 123/81   Pulse 76   Temp 98.1 F (36.7 C) (Oral)   Ht 5' 2"  (1.575 m)   Wt 110 lb (49.9 kg)   LMP 11/28/2017 (LMP Unknown)   BMI 20.12 kg/m   General: Well-nourished, well-developed in no acute distress.  Eyes: No icterus. Conjunctivae pink. Mouth: Oropharyngeal mucosa moist and pink , no lesions erythema or exudate. Neck: Supple, Trachea midline Abdomen: Bowel sounds are normal, nontender, nondistended, no hepatosplenomegaly or masses, no abdominal bruits or hernia , no rebound or guarding.   Extremities: No lower extremity edema. No clubbing or deformities. Neuro: Alert and oriented x 3.  Grossly intact. Skin: Warm and dry, no jaundice.   Psych: Alert and cooperative, normal mood and affect.   Labs: CMP     Component Value Date/Time   NA 138 12/23/2020 1023   NA 143 10/30/2020 1007   K 4.7 12/23/2020 1023   CL 106 12/23/2020 1023   CO2 25 12/23/2020 1023   GLUCOSE 88 12/23/2020 1023   BUN 12 12/23/2020 1023   BUN 7 10/30/2020 1007   CREATININE 0.72 12/23/2020 1023   CALCIUM 8.8 (L) 12/23/2020 1023   PROT 7.3 11/21/2020 1246   PROT 5.9 (L) 10/30/2020 1007   ALBUMIN 3.4 (L)  11/21/2020 1246   ALBUMIN 3.5 (L) 10/30/2020 1007   AST 66 (H) 11/21/2020 1246   ALT 71 (H) 11/21/2020 1246   ALKPHOS 75 11/21/2020 1246   BILITOT 0.3 11/21/2020 1246   BILITOT <0.2 10/30/2020 1007   GFRNONAA >60 12/23/2020 1023   GFRAA 119 10/30/2020 1007   Lab Results  Component Value Date   WBC 6.6 12/23/2020   HGB 12.3 12/23/2020   HCT 41.9 12/23/2020   MCV 77.6 (L) 12/23/2020   PLT 195 12/23/2020    Imaging Studies: No results found.  Assessment and Plan:   Denise Alexander is a 50 y.o. y/o female here for follow-up of iron deficiency anemia  Patient was advised against all drug use, given her history of cocaine use and risks with ongoing cocaine use discussed in detail.  Patient verbalizes understanding  Nutritionist referral due to celiac disease Obtain further lab work to look for any nutritional deficiencies due to celiac disease Obtain DEXA scan  Start Entocort for lymphocytic colitis.  I talked with the pharmacy to help obtain this for the patient as well      Dr [name]

## 2021-02-04 ENCOUNTER — Other Ambulatory Visit: Payer: Self-pay | Admitting: Gerontology

## 2021-02-04 DIAGNOSIS — E559 Vitamin D deficiency, unspecified: Secondary | ICD-10-CM

## 2021-02-04 MED ORDER — VITAMIN D (ERGOCALCIFEROL) 1.25 MG (50000 UNIT) PO CAPS: 50000.0000 [IU] | ORAL_CAPSULE | ORAL | 0 refills | Status: DC

## 2021-02-05 ENCOUNTER — Other Ambulatory Visit: Payer: Medicaid Other

## 2021-02-10 ENCOUNTER — Telehealth: Payer: Self-pay | Admitting: Pharmacist

## 2021-02-10 NOTE — Telephone Encounter (Signed)
02/10/2021 3:44:54 PM - Advair 100/50 refill online with [facility] - Monday, February 10, 2021 3:43 PM -- Refilled Advair 100/50 online with [facility]--Rx#[id number], order# [id number], allow 10-14 business days to receive.

## 2021-02-11 ENCOUNTER — Telehealth: Payer: Self-pay

## 2021-02-11 LAB — SELENIUM SERUM: Selenium, S/P: 75 ug/L — ABNORMAL LOW (ref 93–198)

## 2021-02-11 LAB — VITAMIN D 25 HYDROXY (VIT D DEFICIENCY, FRACTURES): Vit D, 25-Hydroxy: 16 ng/mL — ABNORMAL LOW (ref 30.0–100.0)

## 2021-02-11 LAB — CAROTENE, SERUM: Carotene: <1 ug/dL — ABNORMAL LOW (ref 3–91)

## 2021-02-11 LAB — VITAMIN B1: Thiamine: 152.9 nmol/L (ref 66.5–200.0)

## 2021-02-11 LAB — FOLATE: Folate: <2 ng/mL — ABNORMAL LOW (ref >3.0)

## 2021-02-11 LAB — VITAMIN A: Vitamin A: 14.6 ug/dL — ABNORMAL LOW (ref 20.1–62.0)

## 2021-02-11 LAB — COPPER, SERUM: Copper: 69 ug/dL — ABNORMAL LOW (ref 80–158)

## 2021-02-11 LAB — VITAMIN B12: Vitamin B-12: 391 pg/mL (ref 232–1245)

## 2021-02-11 LAB — VITAMIN E
Vitamin E (Alpha Tocopherol): 1.5 mg/L — ABNORMAL LOW (ref 7.0–25.1)
Vitamin E(Gamma Tocopherol): 0.4 mg/L — ABNORMAL LOW (ref 0.5–5.5)

## 2021-02-11 LAB — VITAMIN B6: Vitamin B6: 4.5 ug/L (ref 2.0–32.8)

## 2021-02-11 LAB — MAGNESIUM: Magnesium: 1.6 mg/dL (ref 1.6–2.3)

## 2021-02-11 LAB — ZINC: Zinc: 41 ug/dL — ABNORMAL LOW (ref 44–115)

## 2021-02-11 NOTE — Telephone Encounter (Signed)
-----   Message from Virgel Manifold, MD sent at 02/03/2021  1:59 PM EST ----- Herb Grays please let the patient know, she is deficient in multiple vitamins and minerals.  It is very important that she follow-up with a nutritionist.  Please help her set up this appointment ASAP.  Please advise her to start taking a woman's multivitamin over-the-counter as well.  Due to low vitamin D, I also recommend that she see her PCP (cc'd here) for supplementation and follow-up on this as well.  I am not sure why her next appointment with me is canceled.  Please ensure she has follow-up with me in 6 weeks

## 2021-02-11 NOTE — Telephone Encounter (Signed)
Patient verbalized understanding of results and will start taking a multivitamin and PCP office called her in vitamin d. She will make appointment with Nutritionist

## 2021-02-12 NOTE — Progress Notes (Addendum)
Feraheme was approved for additional refills on 02/11/2021. Approved for single dose therapy (2 Vials).  For DOS: 02/20/2021, Next DOS: TBD. Additional Fills requires Application filled out from Provider sections 1,3, 4,5,6 and send to [facility].   Madalyn Rob, CPhT IV Drug Replacement Specialist  [place] Phone: [phone]

## 2021-02-13 ENCOUNTER — Ambulatory Visit: Payer: Medicaid Other | Admitting: Gastroenterology

## 2021-02-17 ENCOUNTER — Other Ambulatory Visit: Payer: Self-pay

## 2021-02-17 ENCOUNTER — Ambulatory Visit: Payer: Medicaid Other | Admitting: Pharmacist

## 2021-02-17 DIAGNOSIS — Z79899 Other long term (current) drug therapy: Secondary | ICD-10-CM

## 2021-02-17 NOTE — Progress Notes (Signed)
Medication Management Clinic Visit Note  Patient: Denise Alexander MRN: [id number] Date of Birth: [birth year] PCP: Langston Reusing, NP   [name] 50 y.o. female, was contacted today via telephone for her initial medication therapy management review with the pharmacist. Patient was verified by name and date of birth.  LMP 11/28/2017 (LMP Unknown)   Patient Information   Past Medical History:  Diagnosis Date  . Anemia   . COPD (chronic obstructive pulmonary disease) ([place])   . Depression   . MI (myocardial infarction) ([place]) 2014  . Substance abuse [facility])       Past Surgical History:  Procedure Laterality Date  . COLONOSCOPY WITH PROPOFOL N/A 01/09/2021   Procedure: COLONOSCOPY WITH PROPOFOL;  Surgeon: Virgel Manifold, MD;  Location: [facility];  Service: Endoscopy;  Laterality: N/A;  . ESOPHAGOGASTRODUODENOSCOPY (EGD) WITH PROPOFOL N/A 01/09/2021   Procedure: ESOPHAGOGASTRODUODENOSCOPY (EGD) WITH PROPOFOL;  Surgeon: Virgel Manifold, MD;  Location: [facility];  Service: Endoscopy;  Laterality: N/A;  WILL GET C-19 TEST ON JAN 19 BEFORE 11 AM  . LEFT HEART CATH AND CORONARY ANGIOGRAPHY Right 12/03/2017   Procedure: LEFT HEART CATH AND CORONARY ANGIOGRAPHY;  Surgeon: Dionisio David, MD;  Location: [facility];  Service: Cardiovascular;  Laterality: Right;  . TEE WITHOUT CARDIOVERSION N/A 12/25/2020   Procedure: TRANSESOPHAGEAL ECHOCARDIOGRAM (TEE);  Surgeon: Kate Sable, MD;  Location: [facility];  Service: Cardiovascular;  Laterality: N/A;  . TUBAL LIGATION       Family History  Problem Relation Age of Onset  . Colon cancer Mother   . Ovarian cancer Mother   . Throat cancer Mother   . Skin cancer Mother     New Diagnoses (since last visit):              Social History   Substance and Sexual Activity  Alcohol Use No      Social History   Tobacco Use  Smoking Status Current Every Day Smoker  . Packs/day: 0.50  . Types:  Cigarettes  Smokeless Tobacco Never Used  Tobacco Comment   Started smoking at age 38.     Health Maintenance/Date Completed  Last ED visit: 09/2020 Last Visit to PCP: 01/02/21 Next Visit to PCP: 04/01/21 Specialist Visit: 02/20/21 Dr. Rogue Bussing, 03/26/21 Dr. Bonna Gains, 06/10/21 Dr. Garen Lah Colonoscopy: 01/09/21 Flu Vaccine: 10/03/20    Health Maintenance  Topic Date Due  . COVID-19 Vaccine (1) Never done  . TETANUS/TDAP  Never done  . PAP SMEAR-Modifier  Never done  . COLONOSCOPY (Pts 45-28yr Insurance coverage will need to be confirmed)  01/10/2024  . INFLUENZA VACCINE  Completed  . Hepatitis C Screening  Completed  . HIV Screening  Completed   Outpatient Encounter Medications as of 02/17/2021  Medication Sig  . aspirin 81 MG EC tablet Take 1 tablet (81 mg total) by mouth daily.  .Marland Kitchenatorvastatin (LIPITOR) 40 MG tablet Take 1 tablet (40 mg total) by mouth daily.  . budesonide (ENTOCORT EC) 3 MG 24 hr capsule Take 3 capsules (9 mg total) by mouth daily for 42 days, THEN 2 capsules (6 mg total) daily for 14 days, THEN 1 capsule (3 mg total) daily for 14 days.  . Ferrous Sulfate (IRON) 90 (18 Fe) MG TABS Take 1 tablet by mouth every other day.  . Ferumoxytol (FERAHEME IV) Inject into the vein. As indicated per Oncology  . Fluticasone-Salmeterol (ADVAIR) 100-50 MCG/DOSE AEPB Inhale 1 puff into the lungs in the morning and at bedtime.  .Marland Kitchen  isosorbide mononitrate (IMDUR) 30 MG 24 hr tablet Take 0.5 tablets (15 mg total) by mouth daily.  . nitroGLYCERIN (NITROSTAT) 0.4 MG SL tablet Place 1 tablet (0.4 mg total) under the tongue every 5 (five) minutes as needed for chest pain.  Marland Kitchen omeprazole (PRILOSEC) 40 MG capsule Take 1 capsule (40 mg total) by mouth daily.  . VENTOLIN HFA 108 (90 Base) MCG/ACT inhaler INHALE 2 PUFFS INTO THE LUNGS EVERY 6 HOURS AS NEEDED FOR WHEEZING OR SHORTNESS OF BREATH. (Patient taking differently: No sig reported)  . Vitamin D, Ergocalciferol, (DRISDOL) 1.25 MG  (50000 UNIT) CAPS capsule Take 1 capsule (50,000 Units total) by mouth every 7 (seven) days.  . [DISCONTINUED] Buprenorphine HCl-Naloxone HCl 8-2 MG FILM Place 0.5 tablets under the tongue 2 (two) times daily.   No facility-administered encounter medications on file as of 02/17/2021.     Assessment and Plan:  NSTEMI/CAD 2018- occurred in the context of cocaine use -Currently taking aspirin, isosorbide mononitrate, atorvastatin, nitroglycerin; she has not had to use the nitroglycerin recently. -Left Heart Cath and Coronary angiography 11/2017; TEE 12/25/20 -Followed by Dr. Garen Lah  Hyperlipidemia -per Dr. Garen Lah, goal LDL < 70; currently on a high intensity statin -labs 10/30/20; TC = 133; TG = 75; HDL = 25; LDL = 93  COPD -Advair, prn albuterol inhaler. States she using the albuterol inhaler once daily.  Depression -Referred to counselor at [facility]  Iron Deficiency Anemia - secondary to Celiac disease -Currently taking ferrous sulfate 59m every other day -History of blood transfusion (11/22/20) and Feraheme infusions (starting 11/28/20)  -Followed by Dr. BRogue Bussing-Colonoscopy and EGD completed on 01/09/21  Celiac Disease - -Nutritionist referral made by Dr. TBonna Gains-Patient states she tries to avoid gluten.  Lymphocytic Colitis -  -Entocort taper started 01/30/21, per Dr. TBonna Gains  Renal Artery Disease -CT done 12/19/20; showed at least 50% narrowing of the proximal right renal artery -Referral to Nephrology has been made  GERD -Omeprazole  Substance Use History No longer using buprenorphine-naloxone. History of cocaine use. States she is not currently using drugs.   [facility]  Mirra Basilio K. HDicky Doe PharmD Medication Management Clinic CCatawbaOperations Coordinator [phone]

## 2021-02-20 ENCOUNTER — Inpatient Hospital Stay: Payer: Medicaid Other | Attending: Internal Medicine

## 2021-02-20 ENCOUNTER — Inpatient Hospital Stay: Payer: Medicaid Other

## 2021-02-20 ENCOUNTER — Inpatient Hospital Stay: Payer: Medicaid Other | Admitting: Internal Medicine

## 2021-03-20 ENCOUNTER — Telehealth: Payer: Self-pay | Admitting: Gerontology

## 2021-03-20 NOTE — Telephone Encounter (Signed)
Called and left VM to let her know that her appt on 4/12 was rescheduled to 4/20 at 11:30 am.

## 2021-03-26 ENCOUNTER — Ambulatory Visit: Payer: Medicaid Other | Admitting: Gastroenterology

## 2021-03-26 ENCOUNTER — Other Ambulatory Visit: Payer: Self-pay

## 2021-03-26 ENCOUNTER — Ambulatory Visit: Payer: Medicaid Other | Admitting: Dietician

## 2021-03-26 MED FILL — Fluticasone-Salmeterol Aer Powder BA 100-50 MCG/ACT: RESPIRATORY_TRACT | 90 days supply | Qty: 180 | Fill #0 | Status: AC

## 2021-03-28 ENCOUNTER — Other Ambulatory Visit: Payer: Self-pay

## 2021-04-01 ENCOUNTER — Ambulatory Visit: Payer: Medicaid Other | Admitting: Gerontology

## 2021-04-04 ENCOUNTER — Other Ambulatory Visit: Payer: Self-pay

## 2021-04-04 MED FILL — Albuterol Sulfate Inhal Aero 108 MCG/ACT (90MCG Base Equiv): RESPIRATORY_TRACT | 75 days supply | Qty: 25.5 | Fill #0 | Status: AC

## 2021-04-07 ENCOUNTER — Other Ambulatory Visit: Payer: Self-pay

## 2021-04-09 ENCOUNTER — Ambulatory Visit: Payer: Medicaid Other | Admitting: Gerontology

## 2021-04-10 ENCOUNTER — Telehealth: Payer: Self-pay

## 2021-04-10 NOTE — Telephone Encounter (Signed)
Made appt for pt on 5/12 due to no show of last appt. Tried calling to make pt aware of name/number but was unable to LVM.

## 2021-04-16 ENCOUNTER — Other Ambulatory Visit: Payer: Self-pay

## 2021-04-17 ENCOUNTER — Other Ambulatory Visit: Payer: Self-pay

## 2021-04-17 MED FILL — Ferrous Sulfate Tab 325 MG (65 MG Elemental Fe): ORAL | 30 days supply | Qty: 15 | Fill #0 | Status: AC

## 2021-04-17 MED FILL — Nitroglycerin SL Tab 0.4 MG: SUBLINGUAL | 25 days supply | Qty: 25 | Fill #0 | Status: AC

## 2021-04-17 MED FILL — Ergocalciferol Cap 1.25 MG (50000 Unit): ORAL | 84 days supply | Qty: 12 | Fill #0 | Status: AC

## 2021-04-17 MED FILL — Isosorbide Mononitrate Tab ER 24HR 30 MG: ORAL | 30 days supply | Qty: 15 | Fill #0 | Status: AC

## 2021-04-17 MED FILL — Aspirin Tab Delayed Release 81 MG: ORAL | 30 days supply | Qty: 30 | Fill #0 | Status: AC

## 2021-04-30 ENCOUNTER — Ambulatory Visit: Payer: Medicaid Other | Admitting: Gastroenterology

## 2021-04-30 ENCOUNTER — Encounter: Payer: Self-pay | Admitting: Gastroenterology

## 2021-05-01 ENCOUNTER — Ambulatory Visit: Payer: Self-pay | Admitting: Gerontology

## 2021-05-06 ENCOUNTER — Other Ambulatory Visit: Payer: Self-pay

## 2021-05-07 ENCOUNTER — Ambulatory Visit: Payer: Medicaid Other | Admitting: Dietician

## 2021-05-15 ENCOUNTER — Other Ambulatory Visit: Payer: Self-pay

## 2021-05-26 IMAGING — CT CT CTA ABD/PEL W/CM AND/OR W/O CM
3 of 8 series · 11 of 46 positions shown, 16 images · IV contrast (omnipaque)
Comparison: 04/29/2006, 09/28/2003

CLINICAL DATA: 49-year-old female referred for possible mesenteric
ischemia

EXAM:
CTA ABDOMEN AND PELVIS WITHOUT AND WITH CONTRAST
TECHNIQUE: Multidetector CT imaging of the abdomen and pelvis was performed
using the standard protocol during bolus administration of
intravenous contrast. Multiplanar reconstructed images and MIPs were
obtained and reviewed to evaluate the vascular anatomy.
CONTRAST:  80mL OMNIPAQUE IOHEXOL 350 MG/ML SOLN

[Series 6: arterial thins cta arterial 1.00 · axial · arterial · 0.70mm/px · z∈[-1447,-1249]mm · 5 of 734 slices shown]
[im 74/734  soft-tissue]
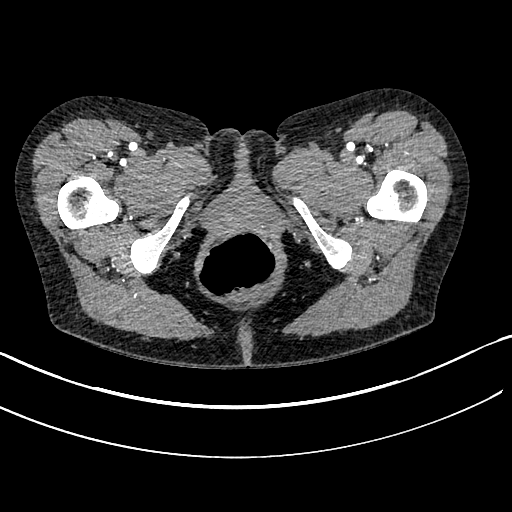
[im 147/734  soft-tissue]
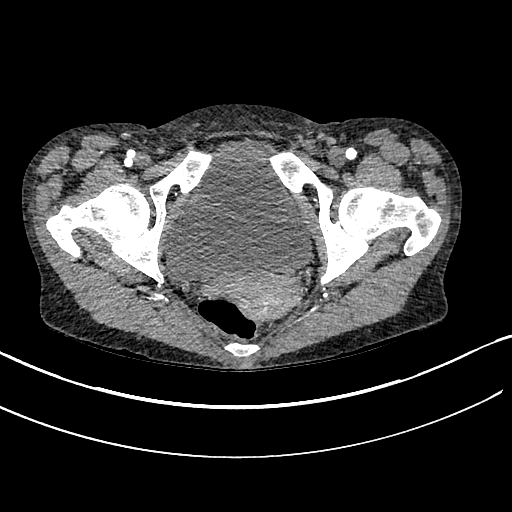
[im 220/734  soft-tissue]
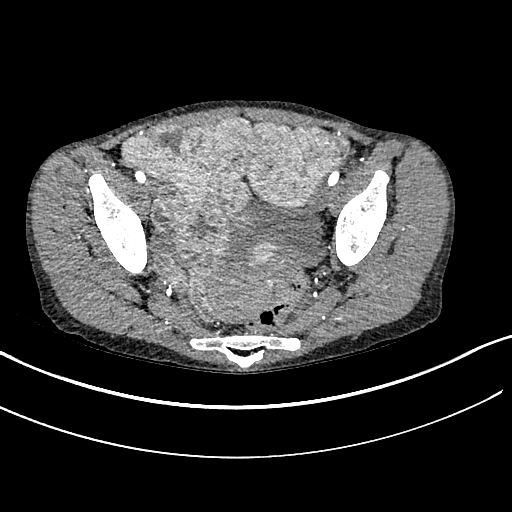
[im 330/734  soft-tissue]
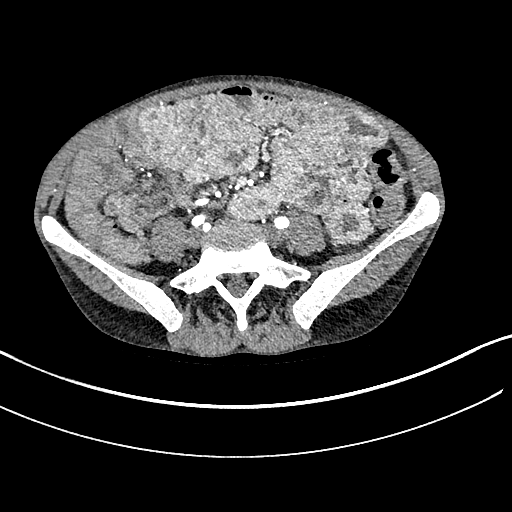
[im 404/734  soft-tissue]
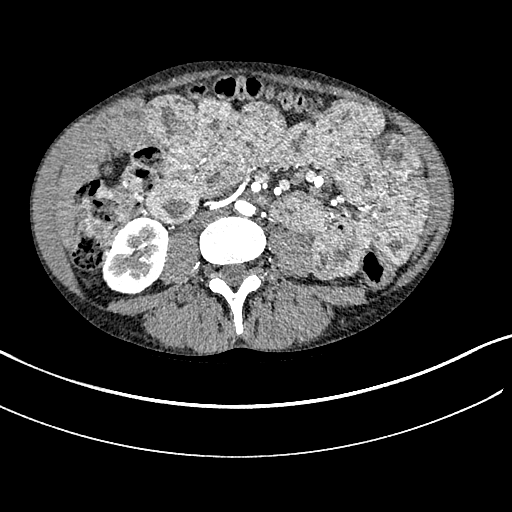

[Series 8: cor art st cta arterial 2.00 cor · coronal · arterial · 0.70mm/px · 3 of 178 slices shown, 4 images]
[im 45/178  soft-tissue]
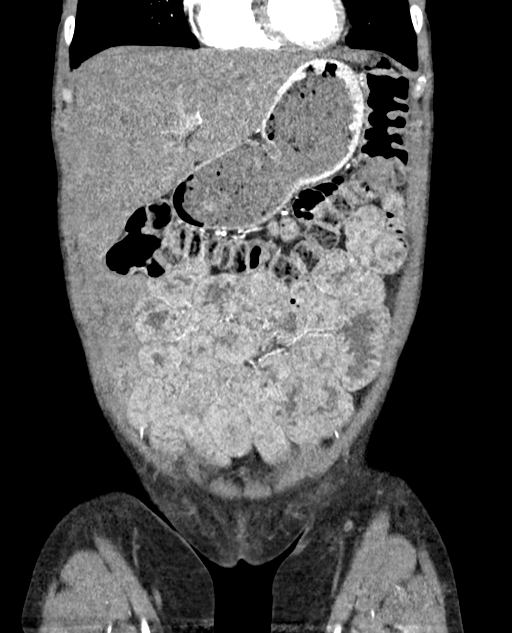
[im 89/178  soft-tissue]
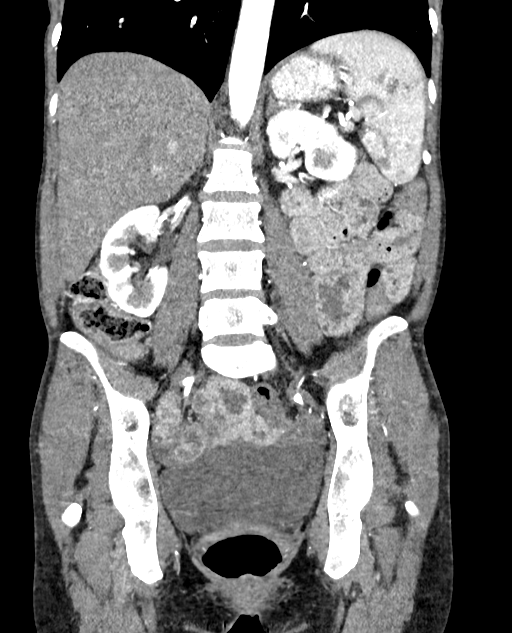
[im 89/178  bone]
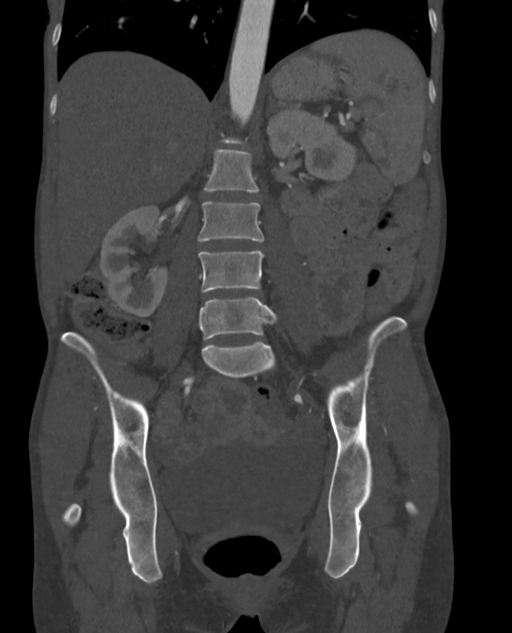
[im 133/178  soft-tissue]
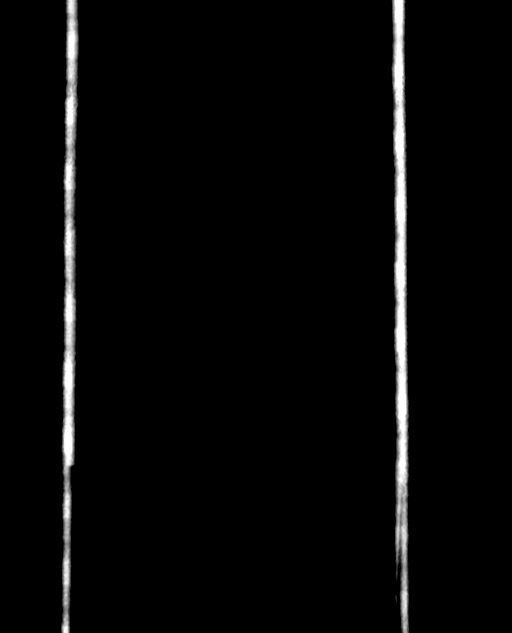

[Series 16: axial st cta venous 5.00 · axial · portal-venous · 0.70mm/px · z∈[-1488,-1053]mm · 3 of 88 slices shown, 7 images]
[im 1/88  soft-tissue]
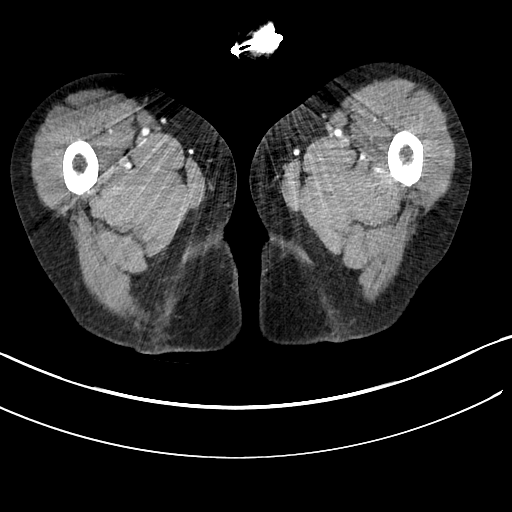
[im 1/88  lung]
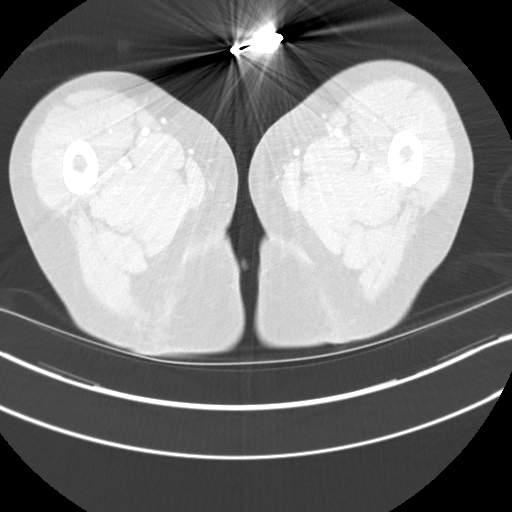
[im 1/88  bone]
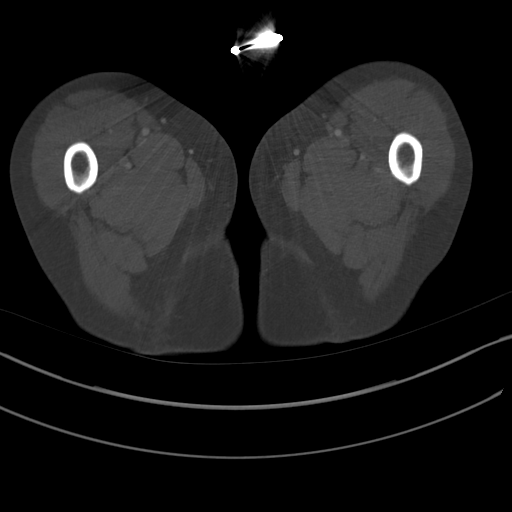
[im 44/88  soft-tissue]
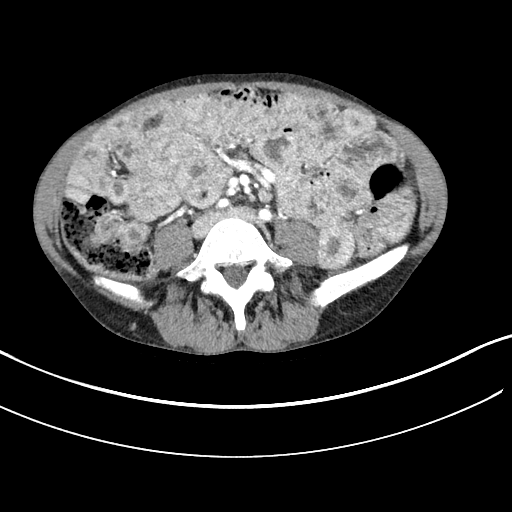
[im 44/88  lung]
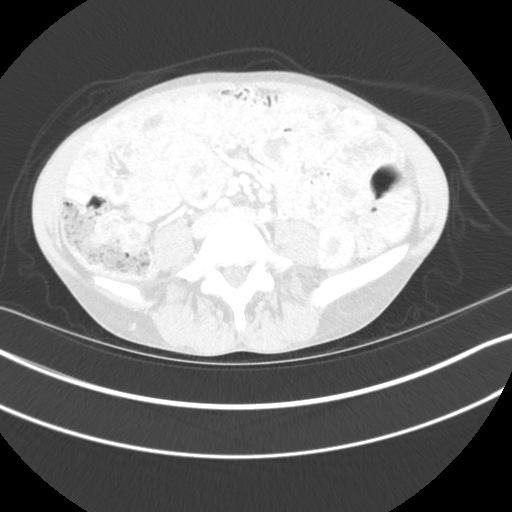
[im 88/88  soft-tissue]
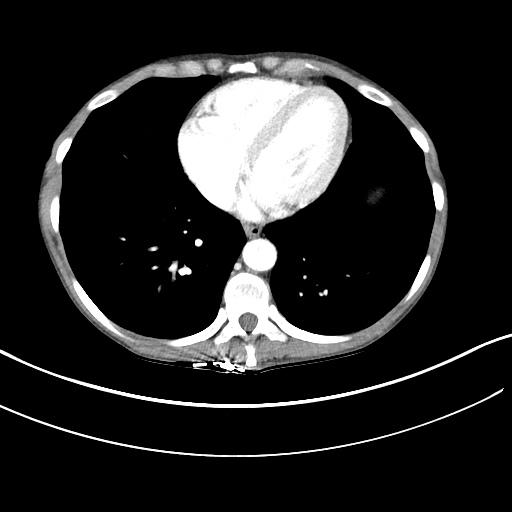
[im 88/88  lung]
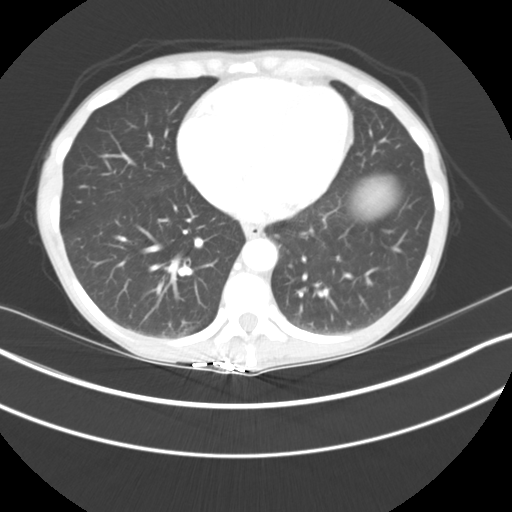

[11 of 46 positions shown; findings below may reference images not displayed]

FINDINGS: VASCULAR

Aorta: Mild atherosclerotic changes of the abdominal aorta. No
aneurysm dissection or ulcerated plaque. No periaortic fluid or
inflammatory changes.

Celiac: Patent, with no significant atherosclerotic changes.

SMA: Patent, with no significant atherosclerotic changes.

Renals:

- Right: Single right renal artery. There is at least 50% narrowing
proximally in the right renal artery, most likely noncalcified
atheromatous plaque.

- Left: Main left renal artery is patent without significant
atherosclerosis. There is accessory left renal artery just inferior
to the main left renal artery, narrowed at the origin secondary to
soft atheromatous plaque.

IMA: IMA patent though appears narrowed at the origin.

Right lower extremity:

Unremarkable course, caliber, and contour of the right iliac system.
Mild atherosclerosis without high-grade stenosis or occlusion. No
aneurysm, dissection, or occlusion. Hypogastric artery is patent.
Common femoral artery patent. Proximal SFA and profunda femoris
patent.

Left lower extremity:

Unremarkable course, caliber, and contour of the left iliac system.
Mild atherosclerosis without high-grade stenosis or occlusion. No
aneurysm, dissection, or occlusion. Hypogastric artery is patent.
Common femoral artery patent. Proximal SFA and profunda femoris
patent.

Veins: Unremarkable appearance of the venous system.

Review of the MIP images confirms the above findings.

NON-VASCULAR

Lower chest: No acute.

Hepatobiliary: Unremarkable appearance of the liver. Unremarkable
gall bladder.

Pancreas: Unremarkable.

Spleen: Unremarkable.

Adrenals/Urinary Tract:

- Right adrenal gland: Unremarkable

- Left adrenal gland: Unremarkable.

- Right kidney: No hydronephrosis, nephrolithiasis, inflammation, or
ureteral dilation. No focal lesion.

- Left Kidney: No hydronephrosis, nephrolithiasis, inflammation, or
ureteral dilation. No focal lesion.

- Urinary Bladder: Urinary bladder somewhat distended.

Stomach/Bowel:

- Stomach: Unremarkable.

- Small bowel: Unremarkable

- Appendix: Normal.

- Colon: Unremarkable.

Lymphatic: No adenopathy.

Mesenteric: No free fluid or air. No mesenteric adenopathy.

Reproductive: Unremarkable appearance of uterus/adnexa

Other: No hernia.

Musculoskeletal: No evidence of acute fracture. No bony canal
narrowing. No significant degenerative changes of the hips.
IMPRESSION: No acute CT finding.

The CT angiogram does not support chronic mesenteric ischemia as a
source of abdominal pain, as there is no significant mesenteric
atherosclerotic changes.

Renal arterial disease, with at least 50% narrowing of the proximal
right renal artery secondary to atheromatous plaque. There is also
narrowing of accessory left renal artery at the origin.

Aortic atherosclerosis and mild iliac arterial disease. Aortic
Atherosclerosis ([id number]).

## 2021-06-03 ENCOUNTER — Other Ambulatory Visit: Payer: Self-pay | Admitting: Gerontology

## 2021-06-03 ENCOUNTER — Other Ambulatory Visit: Payer: Self-pay

## 2021-06-03 DIAGNOSIS — K219 Gastro-esophageal reflux disease without esophagitis: Secondary | ICD-10-CM

## 2021-06-03 MED ORDER — OMEPRAZOLE 40 MG PO CPDR
DELAYED_RELEASE_CAPSULE | Freq: Every day | ORAL | 0 refills | Status: AC
  Filled 2021-06-04: qty 30, 30d supply, fill #0

## 2021-06-03 MED FILL — Albuterol Sulfate Inhal Aero 108 MCG/ACT (90MCG Base Equiv): RESPIRATORY_TRACT | 75 days supply | Qty: 25.5 | Fill #1 | Status: CN

## 2021-06-04 ENCOUNTER — Other Ambulatory Visit: Payer: Self-pay

## 2021-06-04 ENCOUNTER — Encounter: Payer: Self-pay | Admitting: Internal Medicine

## 2021-06-05 ENCOUNTER — Other Ambulatory Visit: Payer: Self-pay

## 2021-06-05 ENCOUNTER — Encounter: Payer: Self-pay | Admitting: Internal Medicine

## 2021-06-05 ENCOUNTER — Telehealth: Payer: Self-pay | Admitting: Gerontology

## 2021-06-05 MED FILL — Nitroglycerin SL Tab 0.4 MG: SUBLINGUAL | 25 days supply | Qty: 25 | Fill #1 | Status: CN

## 2021-06-05 NOTE — Telephone Encounter (Signed)
An attempt to contact the pt was made and a voicemail was left regarding scheduling an in clinic appt.

## 2021-06-09 ENCOUNTER — Telehealth: Payer: Self-pay | Admitting: Pharmacist

## 2021-06-09 NOTE — Telephone Encounter (Signed)
06/09/2021 9:18:18 AM - Advair 100/50 refill online with [facility]  -- Elmer Picker - Monday, June 09, 2021 9:17 AM --Placed refill online with [facility] for Advair 100/50, Order# [id number].

## 2021-06-10 ENCOUNTER — Ambulatory Visit: Payer: Self-pay | Admitting: Cardiology

## 2021-06-10 ENCOUNTER — Telehealth: Payer: Self-pay | Admitting: Gerontology

## 2021-06-10 NOTE — Telephone Encounter (Signed)
Scheduled an in clinic appt on 6/28 at 1:30 pm.  Left a voicemail regarding appt.   - RM

## 2021-06-11 ENCOUNTER — Encounter: Payer: Self-pay | Admitting: Cardiology

## 2021-06-17 ENCOUNTER — Ambulatory Visit: Payer: Medicaid Other | Admitting: Gerontology

## 2021-06-19 ENCOUNTER — Other Ambulatory Visit: Payer: Self-pay

## 2021-07-11 ENCOUNTER — Other Ambulatory Visit: Payer: Self-pay

## 2021-08-22 ENCOUNTER — Other Ambulatory Visit: Payer: Self-pay

## 2021-08-26 ENCOUNTER — Telehealth: Payer: Self-pay | Admitting: Pharmacist

## 2021-08-26 NOTE — Telephone Encounter (Signed)
08/26/2021 3:04:27 PM - ProAir inv 04/25/21/RTS for non pickup  -- Elmer Picker - Tuesday, August 26, 2021 3:02 PM --I have received [employer] invoice dated 04/25/2021 where we received (3) ProAir, patient never picked up. Note on invoice " RTS for non pick up, patient inactive on spreadsheet".  Patient is still enrolled with [employer] till 01/25/22- Energy manager in Nov folder.

## 2021-09-16 NOTE — Progress Notes (Signed)
..  Patient Assist/Replace for the following has been terminated. Medication: Feraheme (ferumoxytol) Reason for Termination: No active treatment in last 6 months and no future appointments scheduled.  Last DOS: 12/11/2020. Marland KitchenJuan Quam, CPhT IV Drug Replacement Specialist [place] Phone: [phone]

## 2021-09-25 ENCOUNTER — Other Ambulatory Visit: Payer: Self-pay

## 2021-11-19 ENCOUNTER — Other Ambulatory Visit: Payer: Self-pay | Admitting: Internal Medicine

## 2021-11-19 DIAGNOSIS — D649 Anemia, unspecified: Secondary | ICD-10-CM

## 2021-12-09 ENCOUNTER — Other Ambulatory Visit: Payer: Self-pay

## 2022-08-03 ENCOUNTER — Telehealth: Payer: Self-pay | Admitting: Cardiology

## 2022-08-03 NOTE — Telephone Encounter (Signed)
Have made several attempts to schedule with no success Deleted recall
